# Patient Record
Sex: Female | Born: 1945 | Race: White | Hispanic: No | Smoking: Never smoker
Health system: Southern US, Community
[De-identification: ages and names within clinical notes are randomized; demographics above are authoritative.]

## PROBLEM LIST (undated history)

## (undated) DIAGNOSIS — K219 Gastro-esophageal reflux disease without esophagitis: Secondary | ICD-10-CM

## (undated) DIAGNOSIS — Z973 Presence of spectacles and contact lenses: Secondary | ICD-10-CM

## (undated) DIAGNOSIS — N3281 Overactive bladder: Secondary | ICD-10-CM

## (undated) DIAGNOSIS — I872 Venous insufficiency (chronic) (peripheral): Secondary | ICD-10-CM

## (undated) DIAGNOSIS — H8309 Labyrinthitis, unspecified ear: Secondary | ICD-10-CM

## (undated) DIAGNOSIS — M1990 Unspecified osteoarthritis, unspecified site: Secondary | ICD-10-CM

## (undated) DIAGNOSIS — I8393 Asymptomatic varicose veins of bilateral lower extremities: Secondary | ICD-10-CM

## (undated) DIAGNOSIS — I499 Cardiac arrhythmia, unspecified: Secondary | ICD-10-CM

## (undated) DIAGNOSIS — K2270 Barrett's esophagus without dysplasia: Secondary | ICD-10-CM

## (undated) HISTORY — PX: NASAL SEPTUM SURGERY: SHX37

## (undated) HISTORY — PX: BREAST CYST ASPIRATION: SHX578

## (undated) HISTORY — DX: Barrett's esophagus without dysplasia: K22.70

---

## 1974-10-10 HISTORY — PX: ABDOMINAL HYSTERECTOMY: SHX81

## 1989-10-10 HISTORY — PX: BREAST BIOPSY: SHX20

## 1993-10-10 HISTORY — PX: KNEE ARTHROSCOPY: SUR90

## 2005-03-07 ENCOUNTER — Ambulatory Visit: Payer: Self-pay | Admitting: Unknown Physician Specialty

## 2005-03-28 ENCOUNTER — Ambulatory Visit: Payer: Self-pay | Admitting: Unknown Physician Specialty

## 2005-07-27 ENCOUNTER — Ambulatory Visit: Payer: Self-pay | Admitting: Neurosurgery

## 2005-11-30 ENCOUNTER — Ambulatory Visit: Payer: Self-pay | Admitting: Unknown Physician Specialty

## 2005-12-30 ENCOUNTER — Ambulatory Visit: Payer: Self-pay | Admitting: Unknown Physician Specialty

## 2007-01-01 ENCOUNTER — Ambulatory Visit: Payer: Self-pay | Admitting: Unknown Physician Specialty

## 2007-10-11 DIAGNOSIS — I499 Cardiac arrhythmia, unspecified: Secondary | ICD-10-CM

## 2007-10-11 HISTORY — DX: Cardiac arrhythmia, unspecified: I49.9

## 2007-12-06 ENCOUNTER — Ambulatory Visit: Payer: Self-pay | Admitting: Unknown Physician Specialty

## 2008-11-18 ENCOUNTER — Ambulatory Visit: Payer: Self-pay | Admitting: Unknown Physician Specialty

## 2009-11-16 ENCOUNTER — Ambulatory Visit: Payer: Self-pay | Admitting: Unknown Physician Specialty

## 2010-11-17 ENCOUNTER — Ambulatory Visit: Payer: Self-pay | Admitting: Unknown Physician Specialty

## 2011-11-22 ENCOUNTER — Ambulatory Visit: Payer: Self-pay | Admitting: Unknown Physician Specialty

## 2012-10-10 HISTORY — PX: ESOPHAGOGASTRODUODENOSCOPY: SHX1529

## 2012-11-15 ENCOUNTER — Ambulatory Visit: Payer: Self-pay | Admitting: Unknown Physician Specialty

## 2013-04-22 ENCOUNTER — Other Ambulatory Visit: Payer: Self-pay

## 2013-04-23 ENCOUNTER — Ambulatory Visit: Payer: Self-pay | Admitting: Physician Assistant

## 2013-04-27 LAB — CULTURE, BLOOD (SINGLE)

## 2013-11-18 ENCOUNTER — Ambulatory Visit: Payer: Self-pay | Admitting: Physician Assistant

## 2014-11-10 ENCOUNTER — Ambulatory Visit: Payer: Self-pay | Admitting: Physician Assistant

## 2015-10-14 ENCOUNTER — Other Ambulatory Visit: Payer: Self-pay | Admitting: Physician Assistant

## 2015-10-14 DIAGNOSIS — Z1231 Encounter for screening mammogram for malignant neoplasm of breast: Secondary | ICD-10-CM

## 2015-11-17 ENCOUNTER — Ambulatory Visit
Admission: RE | Admit: 2015-11-17 | Discharge: 2015-11-17 | Disposition: A | Discharge status: Home / Self Care | Payer: BC Managed Care – PPO | Source: Ambulatory Visit | Attending: Physician Assistant | Admitting: Physician Assistant

## 2015-11-17 DIAGNOSIS — Z1231 Encounter for screening mammogram for malignant neoplasm of breast: Secondary | ICD-10-CM | POA: Insufficient documentation

## 2016-11-10 ENCOUNTER — Other Ambulatory Visit: Payer: Self-pay | Admitting: Physician Assistant

## 2016-11-10 DIAGNOSIS — Z1231 Encounter for screening mammogram for malignant neoplasm of breast: Secondary | ICD-10-CM

## 2016-12-05 ENCOUNTER — Ambulatory Visit
Admission: RE | Admit: 2016-12-05 | Discharge: 2016-12-05 | Disposition: A | Discharge status: Home / Self Care | Payer: BC Managed Care – PPO | Source: Ambulatory Visit | Attending: Physician Assistant | Admitting: Physician Assistant

## 2016-12-05 DIAGNOSIS — Z1231 Encounter for screening mammogram for malignant neoplasm of breast: Secondary | ICD-10-CM | POA: Diagnosis not present

## 2016-12-19 ENCOUNTER — Ambulatory Visit: Payer: BC Managed Care – PPO

## 2016-12-22 ENCOUNTER — Ambulatory Visit: Payer: BC Managed Care – PPO

## 2017-10-18 ENCOUNTER — Other Ambulatory Visit: Payer: Self-pay | Admitting: Physician Assistant

## 2017-10-18 DIAGNOSIS — Z1231 Encounter for screening mammogram for malignant neoplasm of breast: Secondary | ICD-10-CM

## 2017-12-01 ENCOUNTER — Telehealth: Payer: Self-pay | Admitting: Gastroenterology

## 2017-12-01 NOTE — Telephone Encounter (Signed)
L/M BC PT WANTED SOONER APT WITH  DR. Allen Norris IN [facility] HAD OPENING ON 2/28

## 2017-12-05 ENCOUNTER — Ambulatory Visit
Admission: RE | Admit: 2017-12-05 | Discharge: 2017-12-05 | Disposition: A | Discharge status: Home / Self Care | Payer: BC Managed Care – PPO | Source: Ambulatory Visit | Attending: Physician Assistant | Admitting: Physician Assistant

## 2017-12-05 DIAGNOSIS — Z1231 Encounter for screening mammogram for malignant neoplasm of breast: Secondary | ICD-10-CM | POA: Diagnosis present

## 2017-12-07 ENCOUNTER — Encounter: Payer: Self-pay | Admitting: Gastroenterology

## 2017-12-07 ENCOUNTER — Other Ambulatory Visit: Payer: Self-pay

## 2017-12-07 ENCOUNTER — Ambulatory Visit: Payer: BC Managed Care – PPO | Admitting: Gastroenterology

## 2017-12-07 VITALS — BP 129/67 | HR 98 | Ht 62.0 in | Wt 119.0 lb

## 2017-12-07 DIAGNOSIS — N6019 Diffuse cystic mastopathy of unspecified breast: Secondary | ICD-10-CM | POA: Insufficient documentation

## 2017-12-07 DIAGNOSIS — K2270 Barrett's esophagus without dysplasia: Secondary | ICD-10-CM | POA: Diagnosis not present

## 2017-12-07 DIAGNOSIS — K921 Melena: Secondary | ICD-10-CM

## 2017-12-07 DIAGNOSIS — N3281 Overactive bladder: Secondary | ICD-10-CM | POA: Insufficient documentation

## 2017-12-07 DIAGNOSIS — M810 Age-related osteoporosis without current pathological fracture: Secondary | ICD-10-CM | POA: Insufficient documentation

## 2017-12-07 DIAGNOSIS — M1990 Unspecified osteoarthritis, unspecified site: Secondary | ICD-10-CM | POA: Insufficient documentation

## 2017-12-07 DIAGNOSIS — A6000 Herpesviral infection of urogenital system, unspecified: Secondary | ICD-10-CM | POA: Insufficient documentation

## 2017-12-07 DIAGNOSIS — K219 Gastro-esophageal reflux disease without esophagitis: Secondary | ICD-10-CM | POA: Insufficient documentation

## 2017-12-07 DIAGNOSIS — H8309 Labyrinthitis, unspecified ear: Secondary | ICD-10-CM | POA: Insufficient documentation

## 2017-12-07 DIAGNOSIS — E785 Hyperlipidemia, unspecified: Secondary | ICD-10-CM | POA: Insufficient documentation

## 2017-12-07 DIAGNOSIS — K22719 Barrett's esophagus with dysplasia, unspecified: Secondary | ICD-10-CM

## 2017-12-07 NOTE — Progress Notes (Addendum)
Gastroenterology Consultation  Referring Provider:     Marinda Elk, MD Primary Care Physician:  Marinda Elk, MD Primary Gastroenterologist:  Dr. Allen Norris     Reason for Consultation:     Barrett's esophagus and heme positive stools        HPI:   Diana Berg is a 72 y.o. y/o female referred for consultation & management of Barrett's esophagus and heme positive stools by Dr. Carrie Mew, Wilhemena Durie, MD.  This patient, this in today with a finding of heme-positive stools. The patient had a normal CBC. The patient had a history of Barrett's esophagus and her last EGD were done in 2016. She reports that she had a colonoscopy back in 2010 and at that time she had 2 hyperplastic polyps removed. There is no report of any unexplained weight loss fevers chills nausea or vomiting. The patient now comes to me after being referred by her primary care provider because a friend of hers had a good experience with me. The patient denies seeing any black stools or bloody stools. She also reports that she has some abdominal pain but it is intermittent and very minimal. She also states that she has had chronic constipation. She is presently not taking anything for the constipation. The patient does state that in the middle of night she will wake up and have burning in the back of her throat. The patient's presently on Pepcid because she had a reaction to Zantac and was stopped from taking omeprazole because of her history of osteoporosis.  Past Medical History:  Diagnosis Date  . Barrett's esophagus     Past Surgical History:  Procedure Laterality Date  . BREAST CYST ASPIRATION Right 1990's   neg    Prior to Admission medications   Medication Sig Start Date End Date Taking? Authorizing Provider  aspirin (ASPIR-LOW) 81 MG EC tablet Take by mouth. 10/14/15  Yes [provider]  atorvastatin (LIPITOR) 10 MG tablet TK 1 T PO QD 11/27/17  Yes [provider]  Biotin 300 MCG TABS  Take by mouth.   Yes [provider]  Calcium-Magnesium-Vitamin D (CITRACAL CALCIUM+D) 350-09-381 MG-MG-UNIT TB24 Take by mouth.   Yes [provider]  Chromium Picolinate 400 MCG TABS Take by mouth.   Yes [provider]  Coenzyme Q10 100 MG capsule Take by mouth.   Yes [provider]  famotidine (PEPCID) 20 MG tablet TK 1 T PO  Q NIGHT 11/27/17  Yes [provider]  Lutein 20 MG TABS Take by mouth.   Yes [provider]  meclizine (ANTIVERT) 12.5 MG tablet Take by mouth. 10/18/17  Yes [provider]  Multiple Vitamin (MULTI-VITAMINS) TABS Take by mouth.   Yes [provider]  tolterodine (DETROL) 1 MG tablet TK 1 T PO  BID 11/13/17  Yes [provider]  Probiotic CAPS Take by mouth.    [provider]    Family History  Problem Relation Age of Onset  . Heart disease Father   . Breast cancer Neg Hx      Social History   Tobacco Use  . Smoking status: Never Smoker  . Smokeless tobacco: Never Used  Substance Use Topics  . Alcohol use: No    Frequency: Never  . Drug use: No    Allergies as of 12/07/2017 - Review Complete 12/07/2017  Allergen Reaction Noted  . Other  03/24/2014  . Penicillin v  03/24/2014    Review of Systems:  All systems reviewed and negative except where noted in HPI.   Physical Exam:  BP 129/67   Pulse 98   Ht 5\' 2"  (1.575 m)   Wt 119 lb (54 kg)   BMI 21.77 kg/m  No LMP recorded. Patient has had a hysterectomy. Psych:  Alert and cooperative. Normal mood and affect. General:   Alert,  Well-developed, well-nourished, pleasant and cooperative in NAD Head:  Normocephalic and atraumatic. Eyes:  Sclera clear, no icterus.   Conjunctiva pink. Ears:  Normal auditory acuity. Nose:  No deformity, discharge, or lesions. Mouth:  No deformity or lesions,oropharynx pink & moist. Neck:  Supple; no masses or thyromegaly. Lungs:  Respirations even and unlabored.  Clear  throughout to auscultation.   No wheezes, crackles, or rhonchi. No acute distress. Heart:  Regular rate and rhythm; no murmurs, clicks, rubs, or gallops. Abdomen:  Normal bowel sounds.  No bruits.  Soft, non-tender and non-distended without masses, hepatosplenomegaly or hernias noted.  No guarding or rebound tenderness.  Negative Carnett sign.   Rectal:  Deferred.  Msk:  Symmetrical without gross deformities.  Good, equal movement & strength bilaterally. Pulses:  Normal pulses noted. Extremities:  No clubbing or edema.  No cyanosis. Neurologic:  Alert and oriented x3;  grossly normal neurologically. Skin:  Intact without significant lesions or rashes.  No jaundice. Lymph Nodes:  No significant cervical adenopathy. Psych:  Alert and cooperative. Normal mood and affect.  Imaging Studies: Mm Digital Screening Bilateral  Result Date: 12/05/2017 CLINICAL DATA:  Screening. EXAM: DIGITAL SCREENING BILATERAL MAMMOGRAM WITH CAD COMPARISON:  Previous exam(s). ACR Breast Density Category c: The breast tissue is heterogeneously dense, which may obscure small masses. FINDINGS: There are no findings suspicious for malignancy. Images were processed with CAD. IMPRESSION: No mammographic evidence of malignancy. A result letter of this screening mammogram will be mailed directly to the patient. RECOMMENDATION: Screening mammogram in one year. (Code:SM-B-01Y) BI-RADS CATEGORY  1: Negative. Electronically Signed   By: Curlene Dolphin M.D.   On: 12/05/2017 16:06    Assessment and Plan:   Diana Berg is a 72 y.o. y/o female who comes in today with a history of being found to have heme-positive stools with a normal hemoglobin. The patient has also reported constipation and has been told to take MiraLAX every day. The patient will also be set up for a screening colonoscopy and a surveillance EGD for her Barrett's esophagus. The patient's rectal bleeding is likely due from hemorrhoids or fissure because she states she  does feel a tearing when she has hard stools.   Lucilla Lame, MD. Marval Regal   Note: This dictation was prepared with Dragon dictation along with smaller phrase technology. Any transcriptional errors that result from this process are unintentional.

## 2017-12-12 ENCOUNTER — Ambulatory Visit: Payer: BC Managed Care – PPO | Admitting: Gastroenterology

## 2017-12-13 ENCOUNTER — Other Ambulatory Visit: Payer: Self-pay

## 2017-12-13 ENCOUNTER — Encounter: Payer: Self-pay | Admitting: *Deleted

## 2017-12-15 NOTE — Discharge Instructions (Signed)
General Anesthesia, Adult, Care After °These instructions provide you with information about caring for yourself after your procedure. Your health care provider may also give you more specific instructions. Your treatment has been planned according to current medical practices, but problems sometimes occur. Call your health care provider if you have any problems or questions after your procedure. °What can I expect after the procedure? °After the procedure, it is common to have: °· Vomiting. °· A sore throat. °· Mental slowness. ° °It is common to feel: °· Nauseous. °· Cold or shivery. °· Sleepy. °· Tired. °· Sore or achy, even in parts of your body where you did not have surgery. ° °Follow these instructions at home: °For at least 24 hours after the procedure: °· Do not: °? Participate in activities where you could fall or become injured. °? Drive. °? Use heavy machinery. °? Drink alcohol. °? Take sleeping pills or medicines that cause drowsiness. °? Make important decisions or sign legal documents. °? Take care of children on your own. °· Rest. °Eating and drinking °· If you vomit, drink water, juice, or soup when you can drink without vomiting. °· Drink enough fluid to keep your urine clear or pale yellow. °· Make sure you have little or no nausea before eating solid foods. °· Follow the diet recommended by your health care provider. °General instructions °· Have a responsible adult stay with you until you are awake and alert. °· Return to your normal activities as told by your health care provider. Ask your health care provider what activities are safe for you. °· Take over-the-counter and prescription medicines only as told by your health care provider. °· If you smoke, do not smoke without supervision. °· Keep all follow-up visits as told by your health care provider. This is important. °Contact a health care provider if: °· You continue to have nausea or vomiting at home, and medicines are not helpful. °· You  cannot drink fluids or start eating again. °· You cannot urinate after 8-12 hours. °· You develop a skin rash. °· You have fever. °· You have increasing redness at the site of your procedure. °Get help right away if: °· You have difficulty breathing. °· You have chest pain. °· You have unexpected bleeding. °· You feel that you are having a life-threatening or urgent problem. °This information is not intended to replace advice given to you by your health care provider. Make sure you discuss any questions you have with your health care provider. °Document Released: 01/02/2001 Document Revised: 02/29/2016 Document Reviewed: 09/10/2015 °Elsevier Interactive Patient Education © 2018 [employer] Inc. ° °

## 2017-12-18 ENCOUNTER — Ambulatory Visit: Payer: BC Managed Care – PPO | Admitting: Anesthesiology

## 2017-12-18 ENCOUNTER — Ambulatory Visit
Admission: RE | Admit: 2017-12-18 | Discharge: 2017-12-18 | Disposition: A | Discharge status: Home / Self Care | Payer: BC Managed Care – PPO | Source: Ambulatory Visit | Attending: Gastroenterology | Admitting: Gastroenterology

## 2017-12-18 ENCOUNTER — Encounter
Admission: RE | Disposition: A | Discharge status: Home / Self Care | Payer: Self-pay | Source: Ambulatory Visit | Attending: Gastroenterology

## 2017-12-18 DIAGNOSIS — K2270 Barrett's esophagus without dysplasia: Secondary | ICD-10-CM | POA: Diagnosis not present

## 2017-12-18 DIAGNOSIS — K22719 Barrett's esophagus with dysplasia, unspecified: Secondary | ICD-10-CM

## 2017-12-18 DIAGNOSIS — K2980 Duodenitis without bleeding: Secondary | ICD-10-CM | POA: Insufficient documentation

## 2017-12-18 DIAGNOSIS — K2950 Unspecified chronic gastritis without bleeding: Secondary | ICD-10-CM | POA: Insufficient documentation

## 2017-12-18 DIAGNOSIS — N3281 Overactive bladder: Secondary | ICD-10-CM | POA: Diagnosis not present

## 2017-12-18 DIAGNOSIS — R195 Other fecal abnormalities: Secondary | ICD-10-CM | POA: Diagnosis not present

## 2017-12-18 DIAGNOSIS — D123 Benign neoplasm of transverse colon: Secondary | ICD-10-CM | POA: Insufficient documentation

## 2017-12-18 DIAGNOSIS — K640 First degree hemorrhoids: Secondary | ICD-10-CM | POA: Diagnosis not present

## 2017-12-18 DIAGNOSIS — K219 Gastro-esophageal reflux disease without esophagitis: Secondary | ICD-10-CM | POA: Diagnosis not present

## 2017-12-18 DIAGNOSIS — K449 Diaphragmatic hernia without obstruction or gangrene: Secondary | ICD-10-CM | POA: Diagnosis not present

## 2017-12-18 HISTORY — DX: Presence of spectacles and contact lenses: Z97.3

## 2017-12-18 HISTORY — PX: POLYPECTOMY: SHX5525

## 2017-12-18 HISTORY — DX: Asymptomatic varicose veins of bilateral lower extremities: I83.93

## 2017-12-18 HISTORY — DX: Labyrinthitis, unspecified ear: H83.09

## 2017-12-18 HISTORY — PX: COLONOSCOPY WITH PROPOFOL: SHX5780

## 2017-12-18 HISTORY — DX: Cardiac arrhythmia, unspecified: I49.9

## 2017-12-18 HISTORY — DX: Unspecified osteoarthritis, unspecified site: M19.90

## 2017-12-18 HISTORY — DX: Gastro-esophageal reflux disease without esophagitis: K21.9

## 2017-12-18 HISTORY — DX: Venous insufficiency (chronic) (peripheral): I87.2

## 2017-12-18 HISTORY — DX: Overactive bladder: N32.81

## 2017-12-18 HISTORY — PX: ESOPHAGOGASTRODUODENOSCOPY (EGD) WITH PROPOFOL: SHX5813

## 2017-12-18 SURGERY — COLONOSCOPY WITH PROPOFOL
Anesthesia: General | Wound class: Contaminated

## 2017-12-18 MED ORDER — OMEPRAZOLE 20 MG PO CPDR: 20.0000 mg | DELAYED_RELEASE_CAPSULE | Freq: Every day | ORAL | 11 refills | Status: DC

## 2017-12-18 MED ORDER — ACETAMINOPHEN 160 MG/5ML PO SOLN: 325.0000 mg | Freq: Once | ORAL | Status: DC

## 2017-12-18 MED ORDER — LACTATED RINGERS IV SOLN
INTRAVENOUS | Status: DC
  Administered 2017-12-18: 08:00:00 via INTRAVENOUS

## 2017-12-18 MED ORDER — ACETAMINOPHEN 325 MG PO TABS: 325.0000 mg | ORAL_TABLET | Freq: Once | ORAL | Status: DC

## 2017-12-18 MED ORDER — PROPOFOL 10 MG/ML IV BOLUS
INTRAVENOUS | Status: DC | PRN
  Administered 2017-12-18: 100 mg via INTRAVENOUS
  Administered 2017-12-18 (×3): 20 mg via INTRAVENOUS
  Administered 2017-12-18: 10 mg via INTRAVENOUS
  Administered 2017-12-18: 50 mg via INTRAVENOUS

## 2017-12-18 MED ORDER — SODIUM CHLORIDE 0.9 % IV SOLN: INTRAVENOUS | Status: DC

## 2017-12-18 MED ORDER — STERILE WATER FOR IRRIGATION IR SOLN
Status: DC | PRN
  Administered 2017-12-18: 09:00:00

## 2017-12-18 MED ORDER — LIDOCAINE HCL (CARDIAC) 20 MG/ML IV SOLN
INTRAVENOUS | Status: DC | PRN
  Administered 2017-12-18: 30 mg via INTRAVENOUS

## 2017-12-18 SURGICAL SUPPLY — 36 items
BALLN DILATOR 10-12 8 (BALLOONS)
BALLN DILATOR 12-15 8 (BALLOONS)
BALLN DILATOR 15-18 8 (BALLOONS)
BALLN DILATOR CRE 0-12 8 (BALLOONS)
BALLN DILATOR ESOPH 8 10 CRE (MISCELLANEOUS) IMPLANT
BALLOON DILATOR 12-15 8 (BALLOONS) IMPLANT
BALLOON DILATOR 15-18 8 (BALLOONS) IMPLANT
BALLOON DILATOR CRE 0-12 8 (BALLOONS) IMPLANT
BLOCK BITE 60FR ADLT L/F GRN (MISCELLANEOUS) ×3 IMPLANT
CANISTER SUCT 1200ML W/VALVE (MISCELLANEOUS) ×3 IMPLANT
CLIP HMST 235XBRD CATH ROT (MISCELLANEOUS) IMPLANT
CLIP RESOLUTION 360 11X235 (MISCELLANEOUS)
ELECT REM PT RETURN 9FT ADLT (ELECTROSURGICAL)
ELECTRODE REM PT RTRN 9FT ADLT (ELECTROSURGICAL) IMPLANT
FCP ESCP3.2XJMB 240X2.8X (MISCELLANEOUS)
FORCEPS BIOP RAD 4 LRG CAP 4 (CUTTING FORCEPS) ×3 IMPLANT
FORCEPS BIOP RJ4 240 W/NDL (MISCELLANEOUS)
FORCEPS ESCP3.2XJMB 240X2.8X (MISCELLANEOUS) IMPLANT
GOWN CVR UNV OPN BCK APRN NK (MISCELLANEOUS) ×4 IMPLANT
GOWN ISOL THUMB LOOP REG UNIV (MISCELLANEOUS) ×2
INJECTOR VARIJECT VIN23 (MISCELLANEOUS) IMPLANT
KIT DEFENDO VALVE AND CONN (KITS) IMPLANT
KIT ENDO PROCEDURE OLY (KITS) ×3 IMPLANT
MARKER SPOT ENDO TATTOO 5ML (MISCELLANEOUS) IMPLANT
PROBE APC STR FIRE (PROBE) IMPLANT
RETRIEVER NET PLAT FOOD (MISCELLANEOUS) IMPLANT
RETRIEVER NET ROTH 2.5X230 LF (MISCELLANEOUS) IMPLANT
SNARE SHORT THROW 13M SML OVAL (MISCELLANEOUS) IMPLANT
SNARE SHORT THROW 30M LRG OVAL (MISCELLANEOUS) IMPLANT
SNARE SNG USE RND 15MM (INSTRUMENTS) IMPLANT
SPOT EX ENDOSCOPIC TATTOO (MISCELLANEOUS)
SYR INFLATION 60ML (SYRINGE) IMPLANT
TRAP ETRAP POLY (MISCELLANEOUS) IMPLANT
VARIJECT INJECTOR VIN23 (MISCELLANEOUS)
WATER STERILE IRR 250ML POUR (IV SOLUTION) ×3 IMPLANT
WIRE CRE 18-20MM 8CM F G (MISCELLANEOUS) IMPLANT

## 2017-12-18 NOTE — Op Note (Signed)
[facility] Gastroenterology Patient Name: Diana Berg Procedure Date: 12/18/2017 8:23 AM MRN: [id number] Account #: [id number] Date of Birth: 01-Mar-1946 Admit Type: Outpatient Age: 72 Room: [facility] OR ROOM 01 Gender: Female Note Status: Finalized Procedure:            Upper GI endoscopy Indications:          Heme positive stool Providers:            Lucilla Lame MD, MD Referring MD:         Precious Bard, MD (Referring MD) Medicines:            Propofol per Anesthesia Complications:        No immediate complications. Procedure:            Pre-Anesthesia Assessment:                       - Prior to the procedure, a History and Physical was                        performed, and patient medications and allergies were                        reviewed. The patient's tolerance of previous                        anesthesia was also reviewed. The risks and benefits of                        the procedure and the sedation options and risks were                        discussed with the patient. All questions were                        answered, and informed consent was obtained. Prior                        Anticoagulants: The patient has taken no previous                        anticoagulant or antiplatelet agents. ASA Grade                        Assessment: II - A patient with mild systemic disease.                        After reviewing the risks and benefits, the patient was                        deemed in satisfactory condition to undergo the                        procedure.                       After obtaining informed consent, the endoscope was                        passed under direct vision. Throughout the procedure,  the patient's blood pressure, pulse, and oxygen                        saturations were monitored continuously. The Olympus                        [id number]-[id number] Endoscope (S#. [id number]) was introduced   through the mouth, and advanced to the second part of                        duodenum. The upper GI endoscopy was accomplished                        without difficulty. The patient tolerated the procedure                        well. Findings:      A small hiatal hernia was present.      The entire examined stomach was normal. Biopsies were taken with a cold       forceps for histology.      Localized moderate inflammation characterized by erosions was found in       the duodenal bulb. Impression:           - Small hiatal hernia.                       - Normal stomach. Biopsied.                       - Duodenitis. Recommendation:       - Discharge patient to home.                       - Resume previous diet.                       - Continue present medications.                       - Await pathology results.                       - Perform a colonoscopy today. Procedure Code(s):    --- Professional ---                       [phone], Esophagogastroduodenoscopy, flexible, transoral;                        with biopsy, single or multiple Diagnosis Code(s):    --- Professional ---                       R19.5, Other fecal abnormalities                       K29.80, Duodenitis without bleeding CPT copyright 2016 American Medical Association. All rights reserved. The codes documented in this report are preliminary and upon coder review may  be revised to meet current compliance requirements. Lucilla Lame MD, MD 12/18/2017 8:34:40 AM This report has been signed electronically. Number of Addenda: 0 Note Initiated On: 12/18/2017 8:23 AM Total Procedure Duration: 0 hours 2 minutes 16 seconds       [facility]

## 2017-12-18 NOTE — Transfer of Care (Signed)
Immediate Anesthesia Transfer of Care Note  Patient: AELYN STANALAND  Procedure(s) Performed: COLONOSCOPY WITH PROPOFOL (N/A ) ESOPHAGOGASTRODUODENOSCOPY (EGD) WITH PROPOFOL (N/A ) POLYPECTOMY  Patient Location: PACU  Anesthesia Type: General  Level of Consciousness: awake, alert  and patient cooperative  Airway and Oxygen Therapy: Patient Spontanous Breathing and Patient connected to supplemental oxygen  Post-op Assessment: Post-op Vital signs reviewed, Patient's Cardiovascular Status Stable, Respiratory Function Stable, Patent Airway and No signs of Nausea or vomiting  Post-op Vital Signs: Reviewed and stable  Complications: No apparent anesthesia complications

## 2017-12-18 NOTE — Anesthesia Preprocedure Evaluation (Signed)
Anesthesia Evaluation  Patient identified by MRN, date of birth, ID band Patient awake    Reviewed: Allergy & Precautions, H&P , NPO status , Patient's Chart, lab work & pertinent test results  Airway Mallampati: II  TM Distance: >3 FB Neck ROM: full    Dental no notable dental hx.    Pulmonary    Pulmonary exam normal breath sounds clear to auscultation       Cardiovascular Normal cardiovascular exam Rhythm:regular Rate:Normal     Neuro/Psych    GI/Hepatic GERD  ,  Endo/Other    Renal/GU      Musculoskeletal   Abdominal   Peds  Hematology   Anesthesia Other Findings   Reproductive/Obstetrics                             Anesthesia Physical Anesthesia Plan  ASA: II  Anesthesia Plan: General   Post-op Pain Management:    Induction: Intravenous  PONV Risk Score and Plan: 2 and Propofol infusion and Treatment may vary due to age or medical condition  Airway Management Planned: Natural Airway  Additional Equipment:   Intra-op Plan:   Post-operative Plan:   Informed Consent: I have reviewed the patients History and Physical, chart, labs and discussed the procedure including the risks, benefits and alternatives for the proposed anesthesia with the patient or authorized representative who has indicated his/her understanding and acceptance.       Plan Discussed with: CRNA  Anesthesia Plan Comments:         Anesthesia Quick Evaluation  

## 2017-12-18 NOTE — Anesthesia Procedure Notes (Signed)
Date/Time: 12/18/2017 8:25 AM Performed by: Cameron Ali, CRNA Pre-anesthesia Checklist: Patient identified, Emergency Drugs available, Suction available, Timeout performed and Patient being monitored Patient Re-evaluated:Patient Re-evaluated prior to induction Oxygen Delivery Method: Nasal cannula Placement Confirmation: positive ETCO2

## 2017-12-18 NOTE — Op Note (Signed)
[facility] Gastroenterology Patient Name: Leonetta Mcgivern Procedure Date: 12/18/2017 8:22 AM MRN: [id number] Account #: [id number] Date of Birth: Nov 26, 1945 Admit Type: Outpatient Age: 72 Room: [facility] OR ROOM 01 Gender: Female Note Status: Finalized Procedure:            Colonoscopy Indications:          Heme positive stool Providers:            Lucilla Lame MD, MD Medicines:            Propofol per Anesthesia Complications:        No immediate complications. Procedure:            Pre-Anesthesia Assessment:                       - Prior to the procedure, a History and Physical was                        performed, and patient medications and allergies were                        reviewed. The patient's tolerance of previous                        anesthesia was also reviewed. The risks and benefits of                        the procedure and the sedation options and risks were                        discussed with the patient. All questions were                        answered, and informed consent was obtained. Prior                        Anticoagulants: The patient has taken no previous                        anticoagulant or antiplatelet agents. ASA Grade                        Assessment: II - A patient with mild systemic disease.                        After reviewing the risks and benefits, the patient was                        deemed in satisfactory condition to undergo the                        procedure.                       After obtaining informed consent, the colonoscope was                        passed under direct vision. Throughout the procedure,                        the patient's blood pressure, pulse, and  oxygen                        saturations were monitored continuously. The [place] [id number]) was introduced through the                        anus and advanced to the the cecum, identified by                         appendiceal orifice and ileocecal valve. The                        colonoscopy was performed without difficulty. The                        patient tolerated the procedure well. The quality of                        the bowel preparation was excellent. Findings:      The perianal and digital rectal examinations were normal.      A 3 mm polyp was found in the transverse colon. The polyp was sessile.       The polyp was removed with a cold biopsy forceps. Resection and       retrieval were complete.      Non-bleeding internal hemorrhoids were found during retroflexion. The       hemorrhoids were Grade I (internal hemorrhoids that do not prolapse). Impression:           - One 3 mm polyp in the transverse colon, removed with                        a cold biopsy forceps. Resected and retrieved.                       - Non-bleeding internal hemorrhoids. Recommendation:       - Discharge patient to home.                       - Resume previous diet.                       - Continue present medications.                       - Await pathology results.                       - Repeat colonoscopy in 5 years if polyp adenoma and 10                        years if hyperplastic Procedure Code(s):    --- Professional ---                       [phone], Colonoscopy, flexible; with biopsy, single or                        multiple Diagnosis Code(s):    --- Professional ---  R19.5, Other fecal abnormalities                       D12.3, Benign neoplasm of transverse colon (hepatic                        flexure or splenic flexure) CPT copyright 2016 American Medical Association. All rights reserved. The codes documented in this report are preliminary and upon coder review may  be revised to meet current compliance requirements. Lucilla Lame MD, MD 12/18/2017 8:54:56 AM This report has been signed electronically. Number of Addenda: 0 Note Initiated On: 12/18/2017 8:22  AM Scope Withdrawal Time: 0 hours 11 minutes 53 seconds  Total Procedure Duration: 0 hours 15 minutes 51 seconds       [facility]

## 2017-12-18 NOTE — H&P (Signed)
Diana Lame, MD [address] [place], [place] [address] Phone:[phone] Fax : [phone]  Primary Care Physician:  Marinda Elk, MD Primary Gastroenterologist:  Dr. Allen Norris  Pre-Procedure History & Physical: HPI:  Diana Berg is a 72 y.o. female is here for an endoscopy and colonoscopy.   Past Medical History:  Diagnosis Date  . Barrett's esophagus   . Chronic venous insufficiency   . Degenerative joint disease   . Dysrhythmia 2009   PVCs  . GERD (gastroesophageal reflux disease)   . Labyrinthitis    no issues last 3 yrs  . OAB (overactive bladder)   . Osteoarthritis   . Varicose veins of both lower extremities   . Wears contact lenses     Past Surgical History:  Procedure Laterality Date  . ABDOMINAL HYSTERECTOMY  1976  . BREAST BIOPSY Right 1991  . BREAST CYST ASPIRATION Right 1990's   neg  . ESOPHAGOGASTRODUODENOSCOPY  2014  . KNEE ARTHROSCOPY Right 1995  . NASAL SEPTUM SURGERY  1970s    Prior to Admission medications   Medication Sig Start Date End Date Taking? Authorizing Provider  atorvastatin (LIPITOR) 10 MG tablet TK 1 T PO QD 11/27/17  Yes [provider]  Biotin 300 MCG TABS Take by mouth.   Yes [provider]  Calcium-Magnesium-Vitamin D (CITRACAL CALCIUM+D) 902-40-973 MG-MG-UNIT TB24 Take by mouth.   Yes [provider]  Chromium Picolinate 400 MCG TABS Take by mouth.   Yes [provider]  Coenzyme Q10 100 MG capsule Take by mouth.   Yes [provider]  famotidine (PEPCID) 20 MG tablet TK 1 T PO  Q NIGHT 11/27/17  Yes [provider]  Lutein 20 MG TABS Take by mouth.   Yes [provider]  meclizine (ANTIVERT) 12.5 MG tablet Take by mouth. 10/18/17  Yes [provider]  Multiple Vitamin (MULTI-VITAMINS) TABS Take by mouth.   Yes [provider]  tolterodine (DETROL) 1 MG tablet TK 1 T PO  BID 11/13/17  Yes [provider]    Allergies  as of 12/07/2017 - Review Complete 12/07/2017  Allergen Reaction Noted  . Other  03/24/2014  . Penicillin v  03/24/2014    Family History  Problem Relation Age of Onset  . Heart disease Father   . Breast cancer Neg Hx     Social History   Socioeconomic History  . Marital status: Divorced    Spouse name: Not on file  . Number of children: Not on file  . Years of education: Not on file  . Highest education level: Not on file  Social Needs  . Financial resource strain: Not on file  . Food insecurity - worry: Not on file  . Food insecurity - inability: Not on file  . Transportation needs - medical: Not on file  . Transportation needs - non-medical: Not on file  Occupational History  . Not on file  Tobacco Use  . Smoking status: Never Smoker  . Smokeless tobacco: Never Used  Substance and Sexual Activity  . Alcohol use: No    Frequency: Never  . Drug use: No  . Sexual activity: Not on file  Other Topics Concern  . Not on file  Social History Narrative  . Not on file    Review of Systems: See HPI, otherwise negative ROS  Physical Exam: BP 134/88   Pulse 100   Temp 98.4 F (36.9 C) (Temporal)   Resp 16  Ht 5\' 2"  (1.575 m)   Wt 114 lb (51.7 kg)   SpO2 97%   BMI 20.85 kg/m  General:   Alert,  pleasant and cooperative in NAD Head:  Normocephalic and atraumatic. Neck:  Supple; no masses or thyromegaly. Lungs:  Clear throughout to auscultation.    Heart:  Regular rate and rhythm. Abdomen:  Soft, nontender and nondistended. Normal bowel sounds, without guarding, and without rebound.   Neurologic:  Alert and  oriented x4;  grossly normal neurologically.  Impression/Plan: [name] is here for an endoscopy and colonoscopy to be performed for barrett's esophagus and heme positive stools.  Risks, benefits, limitations, and alternatives regarding  endoscopy and colonoscopy have been reviewed with the patient.  Questions have been answered.  All parties  agreeable.   Diana Lame, MD  12/18/2017, 7:37 AM

## 2017-12-18 NOTE — Anesthesia Postprocedure Evaluation (Signed)
Anesthesia Post Note  Patient: Diana Berg  Procedure(s) Performed: COLONOSCOPY WITH PROPOFOL (N/A ) ESOPHAGOGASTRODUODENOSCOPY (EGD) WITH PROPOFOL (N/A ) POLYPECTOMY  Patient location during evaluation: PACU Anesthesia Type: General Level of consciousness: awake and alert and oriented Pain management: satisfactory to patient Vital Signs Assessment: post-procedure vital signs reviewed and stable Respiratory status: spontaneous breathing, nonlabored ventilation and respiratory function stable Cardiovascular status: blood pressure returned to baseline and stable Postop Assessment: Adequate PO intake and No signs of nausea or vomiting Anesthetic complications: no    Raliegh Ip

## 2017-12-20 ENCOUNTER — Encounter: Payer: Self-pay | Admitting: Gastroenterology

## 2017-12-22 ENCOUNTER — Encounter: Payer: Self-pay | Admitting: Gastroenterology

## 2018-01-15 ENCOUNTER — Ambulatory Visit: Payer: BC Managed Care – PPO | Admitting: Gastroenterology

## 2018-02-20 ENCOUNTER — Other Ambulatory Visit: Payer: Self-pay | Admitting: Physician Assistant

## 2018-02-20 DIAGNOSIS — M5090 Cervical disc disorder, unspecified, unspecified cervical region: Secondary | ICD-10-CM

## 2018-02-27 ENCOUNTER — Ambulatory Visit
Admission: RE | Admit: 2018-02-27 | Discharge: 2018-02-27 | Disposition: A | Discharge status: Home / Self Care | Payer: Medicare Other | Source: Ambulatory Visit | Attending: Physician Assistant | Admitting: Physician Assistant

## 2018-02-27 DIAGNOSIS — M40292 Other kyphosis, cervical region: Secondary | ICD-10-CM | POA: Diagnosis not present

## 2018-02-27 DIAGNOSIS — Z981 Arthrodesis status: Secondary | ICD-10-CM | POA: Insufficient documentation

## 2018-02-27 DIAGNOSIS — M50221 Other cervical disc displacement at C4-C5 level: Secondary | ICD-10-CM | POA: Diagnosis not present

## 2018-02-27 DIAGNOSIS — M5090 Cervical disc disorder, unspecified, unspecified cervical region: Secondary | ICD-10-CM | POA: Insufficient documentation

## 2018-06-26 ENCOUNTER — Other Ambulatory Visit: Payer: Self-pay

## 2018-06-26 ENCOUNTER — Encounter: Payer: Self-pay | Admitting: *Deleted

## 2018-06-28 NOTE — Discharge Instructions (Signed)

## 2018-07-03 ENCOUNTER — Ambulatory Visit
Admission: RE | Admit: 2018-07-03 | Discharge: 2018-07-03 | Disposition: A | Discharge status: Home / Self Care | Payer: Medicare Other | Source: Ambulatory Visit | Attending: Ophthalmology | Admitting: Ophthalmology

## 2018-07-03 ENCOUNTER — Encounter
Admission: RE | Disposition: A | Discharge status: Home / Self Care | Payer: Self-pay | Source: Ambulatory Visit | Attending: Ophthalmology

## 2018-07-03 ENCOUNTER — Ambulatory Visit: Payer: Medicare Other | Admitting: Anesthesiology

## 2018-07-03 DIAGNOSIS — K219 Gastro-esophageal reflux disease without esophagitis: Secondary | ICD-10-CM | POA: Diagnosis not present

## 2018-07-03 DIAGNOSIS — I493 Ventricular premature depolarization: Secondary | ICD-10-CM | POA: Diagnosis not present

## 2018-07-03 DIAGNOSIS — I739 Peripheral vascular disease, unspecified: Secondary | ICD-10-CM | POA: Insufficient documentation

## 2018-07-03 DIAGNOSIS — H2512 Age-related nuclear cataract, left eye: Secondary | ICD-10-CM | POA: Diagnosis not present

## 2018-07-03 DIAGNOSIS — E785 Hyperlipidemia, unspecified: Secondary | ICD-10-CM | POA: Diagnosis not present

## 2018-07-03 DIAGNOSIS — I872 Venous insufficiency (chronic) (peripheral): Secondary | ICD-10-CM | POA: Diagnosis not present

## 2018-07-03 DIAGNOSIS — M810 Age-related osteoporosis without current pathological fracture: Secondary | ICD-10-CM | POA: Diagnosis not present

## 2018-07-03 DIAGNOSIS — K2270 Barrett's esophagus without dysplasia: Secondary | ICD-10-CM | POA: Diagnosis not present

## 2018-07-03 DIAGNOSIS — E7800 Pure hypercholesterolemia, unspecified: Secondary | ICD-10-CM | POA: Insufficient documentation

## 2018-07-03 DIAGNOSIS — Z880 Allergy status to penicillin: Secondary | ICD-10-CM | POA: Insufficient documentation

## 2018-07-03 DIAGNOSIS — Z79899 Other long term (current) drug therapy: Secondary | ICD-10-CM | POA: Insufficient documentation

## 2018-07-03 DIAGNOSIS — M1990 Unspecified osteoarthritis, unspecified site: Secondary | ICD-10-CM | POA: Insufficient documentation

## 2018-07-03 HISTORY — PX: CATARACT EXTRACTION W/PHACO: SHX586

## 2018-07-03 SURGERY — PHACOEMULSIFICATION, CATARACT, WITH IOL INSERTION
Anesthesia: Monitor Anesthesia Care | Site: Eye | Laterality: Left

## 2018-07-03 MED ORDER — EPINEPHRINE PF 1 MG/ML IJ SOLN
INTRAOCULAR | Status: DC | PRN
  Administered 2018-07-03: 62 mL via OPHTHALMIC

## 2018-07-03 MED ORDER — BRIMONIDINE TARTRATE-TIMOLOL 0.2-0.5 % OP SOLN
OPHTHALMIC | Status: DC | PRN
  Administered 2018-07-03: 1 [drp] via OPHTHALMIC

## 2018-07-03 MED ORDER — ONDANSETRON HCL 4 MG/2ML IJ SOLN
INTRAMUSCULAR | Status: DC | PRN
  Administered 2018-07-03: 4 mg via INTRAVENOUS

## 2018-07-03 MED ORDER — TETRACAINE HCL 0.5 % OP SOLN
1.0000 [drp] | OPHTHALMIC | Status: DC | PRN
  Administered 2018-07-03 (×2): 1 [drp] via OPHTHALMIC

## 2018-07-03 MED ORDER — LIDOCAINE HCL (PF) 2 % IJ SOLN
INTRAOCULAR | Status: DC | PRN
  Administered 2018-07-03: 1 mL

## 2018-07-03 MED ORDER — MOXIFLOXACIN HCL 0.5 % OP SOLN
1.0000 [drp] | OPHTHALMIC | Status: DC | PRN
  Administered 2018-07-03 (×3): 1 [drp] via OPHTHALMIC

## 2018-07-03 MED ORDER — LACTATED RINGERS IV SOLN: 10.0000 mL/h | INTRAVENOUS | Status: DC

## 2018-07-03 MED ORDER — ARMC OPHTHALMIC DILATING DROPS
1.0000 "application " | OPHTHALMIC | Status: DC | PRN
  Administered 2018-07-03 (×3): 1 via OPHTHALMIC

## 2018-07-03 MED ORDER — ONDANSETRON HCL 4 MG/2ML IJ SOLN: 4.0000 mg | Freq: Once | INTRAMUSCULAR | Status: DC | PRN

## 2018-07-03 MED ORDER — CEFUROXIME OPHTHALMIC INJECTION 1 MG/0.1 ML
INJECTION | OPHTHALMIC | Status: DC | PRN
  Administered 2018-07-03: 0.1 mL via INTRACAMERAL

## 2018-07-03 MED ORDER — MIDAZOLAM HCL 2 MG/2ML IJ SOLN
INTRAMUSCULAR | Status: DC | PRN
  Administered 2018-07-03: 1 mg via INTRAVENOUS

## 2018-07-03 MED ORDER — NA HYALUR & NA CHOND-NA HYALUR 0.4-0.35 ML IO KIT
PACK | INTRAOCULAR | Status: DC | PRN
  Administered 2018-07-03: 1 mL via INTRAOCULAR

## 2018-07-03 MED ORDER — FENTANYL CITRATE (PF) 100 MCG/2ML IJ SOLN
INTRAMUSCULAR | Status: DC | PRN
  Administered 2018-07-03: 50 ug via INTRAVENOUS

## 2018-07-03 SURGICAL SUPPLY — 21 items
CANNULA ANT/CHMB 27G (MISCELLANEOUS) ×1 IMPLANT
CANNULA ANT/CHMB 27GA (MISCELLANEOUS) ×3 IMPLANT
GLOVE SURG LX 7.5 STRW (GLOVE) ×2
GLOVE SURG LX STRL 7.5 STRW (GLOVE) ×1 IMPLANT
GLOVE SURG TRIUMPH 8.0 PF LTX (GLOVE) ×3 IMPLANT
GOWN STRL REUS W/ TWL LRG LVL3 (GOWN DISPOSABLE) ×2 IMPLANT
GOWN STRL REUS W/TWL LRG LVL3 (GOWN DISPOSABLE) ×4
LENS IOL ACRSF IQ ULTRA 12.5 (Intraocular Lens) IMPLANT
LENS IOL ACRYSOF IQ 12.5 (Intraocular Lens) ×3 IMPLANT
MARKER SKIN DUAL TIP RULER LAB (MISCELLANEOUS) ×3 IMPLANT
NDL FILTER BLUNT 18X1 1/2 (NEEDLE) ×1 IMPLANT
NEEDLE FILTER BLUNT 18X 1/2SAF (NEEDLE) ×2
NEEDLE FILTER BLUNT 18X1 1/2 (NEEDLE) ×1 IMPLANT
PACK CATARACT BRASINGTON (MISCELLANEOUS) ×3 IMPLANT
PACK EYE AFTER SURG (MISCELLANEOUS) ×3 IMPLANT
PACK OPTHALMIC (MISCELLANEOUS) ×3 IMPLANT
SYR 3ML LL SCALE MARK (SYRINGE) ×3 IMPLANT
SYR 5ML LL (SYRINGE) ×3 IMPLANT
SYR TB 1ML LUER SLIP (SYRINGE) ×3 IMPLANT
WATER STERILE IRR 500ML POUR (IV SOLUTION) ×3 IMPLANT
WIPE NON LINTING 3.25X3.25 (MISCELLANEOUS) ×3 IMPLANT

## 2018-07-03 NOTE — Anesthesia Preprocedure Evaluation (Signed)
Anesthesia Evaluation  Patient identified by MRN, date of birth, ID band Patient awake    Reviewed: Allergy & Precautions, H&P , NPO status , Patient's Chart, lab work & pertinent test results  Airway Mallampati: II  TM Distance: >3 FB     Dental no notable dental hx.    Pulmonary    breath sounds clear to auscultation       Cardiovascular + Peripheral Vascular Disease (venous insufficiency)   Rhythm:regular Rate:Normal  HLD   Neuro/Psych    GI/Hepatic GERD  ,Barrett's esophagus   Endo/Other    Renal/GU      Musculoskeletal  (+) Arthritis ,   Abdominal   Peds  Hematology   Anesthesia Other Findings   Reproductive/Obstetrics                             Anesthesia Physical  Anesthesia Plan  ASA: II  Anesthesia Plan: MAC   Post-op Pain Management:    Induction: Intravenous  PONV Risk Score and Plan:   Airway Management Planned: Natural Airway and Nasal Cannula  Additional Equipment:   Intra-op Plan:   Post-operative Plan:   Informed Consent: I have reviewed the patients History and Physical, chart, labs and discussed the procedure including the risks, benefits and alternatives for the proposed anesthesia with the patient or authorized representative who has indicated his/her understanding and acceptance.     Plan Discussed with: CRNA  Anesthesia Plan Comments:         Anesthesia Quick Evaluation

## 2018-07-03 NOTE — Op Note (Signed)
OPERATIVE NOTE  Diana Berg [id number] 07/03/2018   PREOPERATIVE DIAGNOSIS:  Nuclear sclerotic cataract left eye. H25.12   POSTOPERATIVE DIAGNOSIS:    Nuclear sclerotic cataract left eye.     PROCEDURE:  Phacoemusification with posterior chamber intraocular lens placement of the left eye   LENS:   Implant Name Type Inv. Item Serial No. Manufacturer Lot No. LRB No. Used  LENS IOL ACRYSOF IQ 12.5 - [id number] Intraocular Lens LENS IOL ACRYSOF IQ 12.5 [id number] ALCON  Left 1        ULTRASOUND TIME: 12  % of 0 minutes 39 seconds, CDE 4.6  SURGEON:  Wyonia Hough, MD   ANESTHESIA:  Topical with tetracaine drops and 2% Xylocaine jelly, augmented with 1% preservative-free intracameral lidocaine.    COMPLICATIONS:  None.   DESCRIPTION OF PROCEDURE:  The patient was identified in the holding room and transported to the operating room and placed in the supine position under the operating microscope.  The left eye was identified as the operative eye and it was prepped and draped in the usual sterile ophthalmic fashion.   A 1 millimeter clear-corneal paracentesis was made at the 3:00 position.  0.5 ml of preservative-free 1% lidocaine was injected into the anterior chamber.  The anterior chamber was filled with Viscoat viscoelastic.  A 2.4 millimeter keratome was used to make a near-clear corneal incision at the 12:00 position.  .  A curvilinear capsulorrhexis was made with a cystotome and capsulorrhexis forceps.  Balanced salt solution was used to hydrodissect and hydrodelineate the nucleus.   Phacoemulsification was then used in stop and chop fashion to remove the lens nucleus and epinucleus.  The remaining cortex was then removed using the irrigation and aspiration handpiece. Provisc was then placed into the capsular bag to distend it for lens placement.  A lens was then injected into the capsular bag.  The remaining viscoelastic was aspirated.   Wounds were hydrated with  balanced salt solution.  The anterior chamber was inflated to a physiologic pressure with balanced salt solution.  No wound leaks were noted. Cefuroxime 0.1 ml of a 10mg /ml solution was injected into the anterior chamber for a dose of 1 mg of intracameral antibiotic at the completion of the case.   Timolol and Brimonidine drops were applied to the eye.  The patient was taken to the recovery room in stable condition without complications of anesthesia or surgery.  [name] 07/03/2018, 11:14 AM

## 2018-07-03 NOTE — Anesthesia Procedure Notes (Signed)
Procedure Name: MAC Performed by: Chrislynn Mosely, CRNA Pre-anesthesia Checklist: Patient identified, Emergency Drugs available, Suction available, Timeout performed and Patient being monitored Patient Re-evaluated:Patient Re-evaluated prior to induction Oxygen Delivery Method: Nasal cannula Placement Confirmation: positive ETCO2       

## 2018-07-03 NOTE — Anesthesia Postprocedure Evaluation (Signed)
Anesthesia Post Note  Patient: Diana Berg  Procedure(s) Performed: CATARACT EXTRACTION PHACO AND INTRAOCULAR LENS PLACEMENT (IOC)  LEFT (Left Eye)  Patient location during evaluation: PACU Anesthesia Type: MAC Level of consciousness: awake and alert Pain management: pain level controlled Vital Signs Assessment: post-procedure vital signs reviewed and stable Respiratory status: spontaneous breathing, nonlabored ventilation, respiratory function stable and patient connected to nasal cannula oxygen Cardiovascular status: stable and blood pressure returned to baseline Postop Assessment: no apparent nausea or vomiting Anesthetic complications: no    Veda Canning

## 2018-07-03 NOTE — H&P (Signed)
The History and Physical notes are on paper, have been signed, and are to be scanned. The patient remains stable and unchanged from the H&P.   Previous H&P reviewed, patient examined, and there are no changes.  Diana Berg 07/03/2018 9:15 AM

## 2018-07-03 NOTE — Transfer of Care (Signed)
Immediate Anesthesia Transfer of Care Note  Patient: Diana Berg  Procedure(s) Performed: CATARACT EXTRACTION PHACO AND INTRAOCULAR LENS PLACEMENT (I[facility])  LEFT (Left Eye)  Patient Location: PACU  Anesthesia Type: MAC  Level of Consciousness: awake, alert  and patient cooperative  Airway and Oxygen Therapy: Patient Spontanous Breathing and Patient connected to supplemental oxygen  Post-op Assessment: Post-op Vital signs reviewed, Patient's Cardiovascular Status Stable, Respiratory Function Stable, Patent Airway and No signs of Nausea or vomiting  Post-op Vital Signs: Reviewed and stable  Complications: No apparent anesthesia complications

## 2018-07-04 ENCOUNTER — Encounter: Payer: Self-pay | Admitting: Ophthalmology

## 2018-07-19 ENCOUNTER — Other Ambulatory Visit: Payer: Self-pay

## 2018-07-19 ENCOUNTER — Encounter: Payer: Self-pay | Admitting: *Deleted

## 2018-07-23 NOTE — Discharge Instructions (Signed)

## 2018-07-25 ENCOUNTER — Ambulatory Visit: Payer: Medicare Other | Admitting: Anesthesiology

## 2018-07-25 ENCOUNTER — Encounter
Admission: RE | Disposition: A | Discharge status: Home / Self Care | Payer: Self-pay | Source: Ambulatory Visit | Attending: Ophthalmology

## 2018-07-25 ENCOUNTER — Ambulatory Visit
Admission: RE | Admit: 2018-07-25 | Discharge: 2018-07-25 | Disposition: A | Discharge status: Home / Self Care | Payer: Medicare Other | Source: Ambulatory Visit | Attending: Ophthalmology | Admitting: Ophthalmology

## 2018-07-25 DIAGNOSIS — I493 Ventricular premature depolarization: Secondary | ICD-10-CM | POA: Diagnosis not present

## 2018-07-25 DIAGNOSIS — H2511 Age-related nuclear cataract, right eye: Secondary | ICD-10-CM | POA: Insufficient documentation

## 2018-07-25 DIAGNOSIS — M810 Age-related osteoporosis without current pathological fracture: Secondary | ICD-10-CM | POA: Diagnosis not present

## 2018-07-25 DIAGNOSIS — E7800 Pure hypercholesterolemia, unspecified: Secondary | ICD-10-CM | POA: Diagnosis not present

## 2018-07-25 DIAGNOSIS — M1990 Unspecified osteoarthritis, unspecified site: Secondary | ICD-10-CM | POA: Diagnosis not present

## 2018-07-25 DIAGNOSIS — Z880 Allergy status to penicillin: Secondary | ICD-10-CM | POA: Diagnosis not present

## 2018-07-25 HISTORY — PX: CATARACT EXTRACTION W/PHACO: SHX586

## 2018-07-25 SURGERY — PHACOEMULSIFICATION, CATARACT, WITH IOL INSERTION
Anesthesia: Monitor Anesthesia Care | Site: Eye | Laterality: Right

## 2018-07-25 MED ORDER — NA HYALUR & NA CHOND-NA HYALUR 0.4-0.35 ML IO KIT
PACK | INTRAOCULAR | Status: DC | PRN
  Administered 2018-07-25: 1 mL via INTRAOCULAR

## 2018-07-25 MED ORDER — LACTATED RINGERS IV SOLN: 10.0000 mL/h | INTRAVENOUS | Status: DC

## 2018-07-25 MED ORDER — BRIMONIDINE TARTRATE-TIMOLOL 0.2-0.5 % OP SOLN
OPHTHALMIC | Status: DC | PRN
  Administered 2018-07-25: 1 [drp] via OPHTHALMIC

## 2018-07-25 MED ORDER — MIDAZOLAM HCL 2 MG/2ML IJ SOLN
INTRAMUSCULAR | Status: DC | PRN
  Administered 2018-07-25 (×2): 1 mg via INTRAVENOUS

## 2018-07-25 MED ORDER — MOXIFLOXACIN HCL 0.5 % OP SOLN
1.0000 [drp] | OPHTHALMIC | Status: DC | PRN
  Administered 2018-07-25 (×3): 1 [drp] via OPHTHALMIC

## 2018-07-25 MED ORDER — ONDANSETRON HCL 4 MG/2ML IJ SOLN
4.0000 mg | Freq: Once | INTRAMUSCULAR | Status: AC | PRN
  Administered 2018-07-25: 4 mg via INTRAVENOUS

## 2018-07-25 MED ORDER — EPINEPHRINE PF 1 MG/ML IJ SOLN
INTRAOCULAR | Status: DC | PRN
  Administered 2018-07-25: 64 mL via OPHTHALMIC

## 2018-07-25 MED ORDER — TETRACAINE HCL 0.5 % OP SOLN
1.0000 [drp] | OPHTHALMIC | Status: DC | PRN
  Administered 2018-07-25 (×2): 1 [drp] via OPHTHALMIC

## 2018-07-25 MED ORDER — LIDOCAINE HCL (PF) 2 % IJ SOLN
INTRAOCULAR | Status: DC | PRN
  Administered 2018-07-25: 2 mL

## 2018-07-25 MED ORDER — FENTANYL CITRATE (PF) 100 MCG/2ML IJ SOLN
INTRAMUSCULAR | Status: DC | PRN
  Administered 2018-07-25: 50 ug via INTRAVENOUS

## 2018-07-25 MED ORDER — CEFUROXIME OPHTHALMIC INJECTION 1 MG/0.1 ML
INJECTION | OPHTHALMIC | Status: DC | PRN
  Administered 2018-07-25: .2 mL via INTRACAMERAL

## 2018-07-25 MED ORDER — ARMC OPHTHALMIC DILATING DROPS
1.0000 "application " | OPHTHALMIC | Status: DC | PRN
  Administered 2018-07-25 (×3): 1 via OPHTHALMIC

## 2018-07-25 SURGICAL SUPPLY — 20 items
ACRYSOF IQ TORIC (Intraocular Lens) ×2 IMPLANT
CANNULA ANT/CHMB 27G (MISCELLANEOUS) ×1 IMPLANT
CANNULA ANT/CHMB 27GA (MISCELLANEOUS) ×3 IMPLANT
GLOVE SURG LX 7.5 STRW (GLOVE) ×2
GLOVE SURG LX STRL 7.5 STRW (GLOVE) ×1 IMPLANT
GLOVE SURG TRIUMPH 8.0 PF LTX (GLOVE) ×3 IMPLANT
GOWN STRL REUS W/ TWL LRG LVL3 (GOWN DISPOSABLE) ×2 IMPLANT
GOWN STRL REUS W/TWL LRG LVL3 (GOWN DISPOSABLE) ×4
MARKER SKIN DUAL TIP RULER LAB (MISCELLANEOUS) ×3 IMPLANT
NDL FILTER BLUNT 18X1 1/2 (NEEDLE) ×1 IMPLANT
NEEDLE FILTER BLUNT 18X 1/2SAF (NEEDLE) ×2
NEEDLE FILTER BLUNT 18X1 1/2 (NEEDLE) ×1 IMPLANT
PACK CATARACT BRASINGTON (MISCELLANEOUS) ×3 IMPLANT
PACK EYE AFTER SURG (MISCELLANEOUS) ×3 IMPLANT
PACK OPTHALMIC (MISCELLANEOUS) ×3 IMPLANT
SYR 3ML LL SCALE MARK (SYRINGE) ×3 IMPLANT
SYR 5ML LL (SYRINGE) ×3 IMPLANT
SYR TB 1ML LUER SLIP (SYRINGE) ×3 IMPLANT
WATER STERILE IRR 500ML POUR (IV SOLUTION) ×3 IMPLANT
WIPE NON LINTING 3.25X3.25 (MISCELLANEOUS) ×3 IMPLANT

## 2018-07-25 NOTE — Transfer of Care (Signed)
Immediate Anesthesia Transfer of Care Note  Patient: Diana Berg  Procedure(s) Performed: CATARACT EXTRACTION PHACO AND INTRAOCULAR LENS PLACEMENT (I[facility])RIGHT TORIC LENS (Right Eye)  Patient Location: PACU  Anesthesia Type: MAC  Level of Consciousness: awake, alert  and patient cooperative  Airway and Oxygen Therapy: Patient Spontanous Breathing and Patient connected to supplemental oxygen  Post-op Assessment: Post-op Vital signs reviewed, Patient's Cardiovascular Status Stable, Respiratory Function Stable, Patent Airway and No signs of Nausea or vomiting  Post-op Vital Signs: Reviewed and stable  Complications: No apparent anesthesia complications

## 2018-07-25 NOTE — Anesthesia Postprocedure Evaluation (Signed)
Anesthesia Post Note  Patient: Diana Berg  Procedure(s) Performed: CATARACT EXTRACTION PHACO AND INTRAOCULAR LENS PLACEMENT (I[facility])RIGHT TORIC LENS (Right Eye)  Patient location during evaluation: PACU Anesthesia Type: MAC Level of consciousness: awake and alert Pain management: pain level controlled Vital Signs Assessment: post-procedure vital signs reviewed and stable Respiratory status: spontaneous breathing Cardiovascular status: blood pressure returned to baseline Anesthetic complications: no    Jaci Standard, III,  Quinton Voth D

## 2018-07-25 NOTE — Anesthesia Preprocedure Evaluation (Signed)
Anesthesia Evaluation  Patient identified by MRN, date of birth, ID band Patient awake    Reviewed: Allergy & Precautions, H&P , NPO status , Patient's Chart, lab work & pertinent test results  Airway Mallampati: II  TM Distance: >3 FB Neck ROM: full    Dental no notable dental hx.    Pulmonary neg pulmonary ROS,    Pulmonary exam normal breath sounds clear to auscultation       Cardiovascular negative cardio ROS Normal cardiovascular exam     Neuro/Psych    GI/Hepatic Neg liver ROS, Medicated,  Endo/Other  negative endocrine ROS  Renal/GU negative Renal ROS     Musculoskeletal   Abdominal   Peds  Hematology negative hematology ROS (+)   Anesthesia Other Findings   Reproductive/Obstetrics negative OB ROS                             Anesthesia Physical Anesthesia Plan  ASA: II  Anesthesia Plan: MAC   Post-op Pain Management:    Induction:   PONV Risk Score and Plan:   Airway Management Planned:   Additional Equipment:   Intra-op Plan:   Post-operative Plan:   Informed Consent: I have reviewed the patients History and Physical, chart, labs and discussed the procedure including the risks, benefits and alternatives for the proposed anesthesia with the patient or authorized representative who has indicated his/her understanding and acceptance.     Plan Discussed with:   Anesthesia Plan Comments:         Anesthesia Quick Evaluation

## 2018-07-25 NOTE — Anesthesia Procedure Notes (Signed)
Procedure Name: MAC Date/Time: 07/25/2018 9:11 AM Performed by: Janna Arch, CRNA Pre-anesthesia Checklist: Patient identified, Emergency Drugs available, Suction available, Timeout performed and Patient being monitored Patient Re-evaluated:Patient Re-evaluated prior to induction Oxygen Delivery Method: Nasal cannula Placement Confirmation: positive ETCO2

## 2018-07-25 NOTE — H&P (Signed)
The History and Physical notes are on paper, have been signed, and are to be scanned. The patient remains stable and unchanged from the H&P.   Previous H&P reviewed, patient examined, and there are no changes.  Geno Sydnor 07/25/2018 8:44 AM

## 2018-07-25 NOTE — Op Note (Signed)
LOCATION:  [place]   PREOPERATIVE DIAGNOSIS:  Nuclear sclerotic cataract of the right eye.  H25.11   POSTOPERATIVE DIAGNOSIS:  Nuclear sclerotic cataract of the right eye.   PROCEDURE:  Phacoemulsification with Toric posterior chamber intraocular lens placement of the right eye.   LENS:   Implant Name Type Inv. Item Serial No. Manufacturer Lot No. LRB No. Used  ACRYSOF IQ TORIC Intraocular Lens  [id number] ALCON  Right 1     [id number] 11.0 D  Toric intraocular lens with 2.25 diopters of cylindrical power with axis orientation at 110 degrees.   ULTRASOUND TIME: 12 % of 1 minutes, 9 seconds.  CDE 8.0   SURGEON:  Wyonia Hough, MD   ANESTHESIA: Topical with tetracaine drops and 2% Xylocaine jelly, augmented with 1% preservative-free intracameral lidocaine. .   COMPLICATIONS:  None.   DESCRIPTION OF PROCEDURE:  The patient was identified in the holding room and transported to the operating suite and placed in the supine position under the operating microscope.  The right eye was identified as the operative eye, and it was prepped and draped in the usual sterile ophthalmic fashion.    A clear-corneal paracentesis incision was made at the 12:00 position.  0.5 ml of preservative-free 1% lidocaine was injected into the anterior chamber. The anterior chamber was filled with Viscoat.  A 2.4 millimeter near clear corneal incision was then made at the 9:00 position.  A cystotome and capsulorrhexis forceps were then used to make a curvilinear capsulorrhexis.  Hydrodissection and hydrodelineation were then performed using balanced salt solution.   Phacoemulsification was then used in stop and chop fashion to remove the lens, nucleus and epinucleus.  The remaining cortex was aspirated using the irrigation and aspiration handpiece.  Provisc viscoelastic was then placed into the capsular bag to distend it for lens placement.  The Verion digital marker was used to align the implant at  the intended axis.   A Toric lens was then injected into the capsular bag.  It was rotated clockwise until the axis marks on the lens were approximately 15 degrees in the counterclockwise direction to the intended alignment.  The viscoelastic was aspirated from the eye using the irrigation aspiration handpiece.  Then, a Koch spatula through the sideport incision was used to rotate the lens in a clockwise direction until the axis markings of the intraocular lens were lined up with the Verion alignment.  Balanced salt solution was then used to hydrate the wounds. Cefuroxime 0.1 ml of a 10mg /ml solution was injected into the anterior chamber for a dose of 1 mg of intracameral antibiotic at the completion of the case.    The eye was noted to have a physiologic pressure and there was no wound leak noted.   Timolol and Brimonidine drops were applied to the eye.  The patient was taken to the recovery room in stable condition having had no complications of anesthesia or surgery.  Jendayi Berling 07/25/2018, 9:33 AM

## 2018-07-26 ENCOUNTER — Encounter: Payer: Self-pay | Admitting: Ophthalmology

## 2018-10-23 DIAGNOSIS — R195 Other fecal abnormalities: Secondary | ICD-10-CM

## 2018-10-23 DIAGNOSIS — K2980 Duodenitis without bleeding: Secondary | ICD-10-CM

## 2018-10-23 DIAGNOSIS — D123 Benign neoplasm of transverse colon: Secondary | ICD-10-CM

## 2018-10-24 ENCOUNTER — Other Ambulatory Visit: Payer: Self-pay | Admitting: Physician Assistant

## 2018-10-24 DIAGNOSIS — Z1231 Encounter for screening mammogram for malignant neoplasm of breast: Secondary | ICD-10-CM

## 2018-11-20 ENCOUNTER — Ambulatory Visit
Admission: RE | Admit: 2018-11-20 | Discharge: 2018-11-20 | Disposition: A | Discharge status: Home / Self Care | Payer: Medicare Other | Source: Ambulatory Visit | Attending: Physician Assistant | Admitting: Physician Assistant

## 2018-11-20 DIAGNOSIS — Z1231 Encounter for screening mammogram for malignant neoplasm of breast: Secondary | ICD-10-CM | POA: Diagnosis present

## 2019-04-06 ENCOUNTER — Other Ambulatory Visit: Payer: Self-pay | Admitting: Family Medicine

## 2019-04-06 DIAGNOSIS — Z20822 Contact with and (suspected) exposure to covid-19: Secondary | ICD-10-CM

## 2019-04-12 LAB — NOVEL CORONAVIRUS, NAA: SARS-CoV-2, NAA: NOT DETECTED

## 2019-07-16 ENCOUNTER — Other Ambulatory Visit: Payer: Self-pay

## 2019-07-16 ENCOUNTER — Ambulatory Visit (INDEPENDENT_AMBULATORY_CARE_PROVIDER_SITE_OTHER): Payer: Medicare Other | Admitting: Vascular Surgery

## 2019-07-16 ENCOUNTER — Encounter (INDEPENDENT_AMBULATORY_CARE_PROVIDER_SITE_OTHER): Payer: Self-pay | Admitting: Vascular Surgery

## 2019-07-16 VITALS — BP 132/74 | HR 90 | Resp 10 | Ht 61.0 in | Wt 116.0 lb

## 2019-07-16 DIAGNOSIS — M79609 Pain in unspecified limb: Secondary | ICD-10-CM | POA: Insufficient documentation

## 2019-07-16 DIAGNOSIS — E785 Hyperlipidemia, unspecified: Secondary | ICD-10-CM

## 2019-07-16 DIAGNOSIS — M79605 Pain in left leg: Secondary | ICD-10-CM

## 2019-07-16 DIAGNOSIS — M79604 Pain in right leg: Secondary | ICD-10-CM

## 2019-07-16 DIAGNOSIS — M7989 Other specified soft tissue disorders: Secondary | ICD-10-CM

## 2019-07-16 NOTE — Assessment & Plan Note (Signed)
lipid control important in reducing the progression of atherosclerotic disease. Continue statin therapy  

## 2019-07-16 NOTE — Assessment & Plan Note (Signed)

## 2019-07-16 NOTE — Progress Notes (Signed)
Patient ID: Diana Berg, female   DOB: 02/13/1946, 73 y.o.   MRN: [id number]  Chief Complaint  Patient presents with  . New Patient (Initial Visit)    HPI Diana Berg is a 73 y.o. female.  I am asked to see the patient by Alvira Philips for evaluation of venous insufficiency.  The patient reports progressively worsening pain and swelling in her legs over the past several years.  The right leg is the more severely affected leg.  It feels tight and heavy.  She does describe what is worrisome for claudication symptoms in the right calf when she walks any distance.  The calf cramps and aches and is relieved by rest.  She has had prominent varicosities for many years, but for a long time they were not causing any symptoms.  The patient reports no ulceration or infection.  She has some the same symptoms in the left leg although not as severe.  There is no clear inciting event or causative factor that started the symptoms.  She also reports that she has restless leg syndrome which is getting worse.     Past Medical History:  Diagnosis Date  . Barrett's esophagus   . Chronic venous insufficiency   . Degenerative joint disease   . Dysrhythmia 2009   PVCs  . GERD (gastroesophageal reflux disease)   . Labyrinthitis    no issues last 3 yrs  . OAB (overactive bladder)   . Osteoarthritis   . Varicose veins of both lower extremities   . Wears contact lenses     Past Surgical History:  Procedure Laterality Date  . ABDOMINAL HYSTERECTOMY  1976  . BREAST BIOPSY Right 1991  . BREAST CYST ASPIRATION Right 1990's   neg  . CATARACT EXTRACTION W/PHACO Left 07/03/2018   Procedure: CATARACT EXTRACTION PHACO AND INTRAOCULAR LENS PLACEMENT ([place])  LEFT;  Surgeon: Leandrew Koyanagi, MD;  Location: [facility];  Service: Ophthalmology;  Laterality: Left;  PREFERS EARLY MORNING ARRIVAL CAN'T ARRIVE BEFORE [phone]  . CATARACT EXTRACTION W/PHACO Right 07/25/2018   Procedure: CATARACT  EXTRACTION PHACO AND INTRAOCULAR LENS PLACEMENT ([facility])RIGHT TORIC LENS;  Surgeon: Leandrew Koyanagi, MD;  Location: [facility];  Service: Ophthalmology;  Laterality: Right;  Requests early  . COLONOSCOPY WITH PROPOFOL N/A 12/18/2017   Procedure: COLONOSCOPY WITH PROPOFOL;  Surgeon: Lucilla Lame, MD;  Location: [facility];  Service: Endoscopy;  Laterality: N/A;  . ESOPHAGOGASTRODUODENOSCOPY  2014  . ESOPHAGOGASTRODUODENOSCOPY (EGD) WITH PROPOFOL N/A 12/18/2017   Procedure: ESOPHAGOGASTRODUODENOSCOPY (EGD) WITH PROPOFOL;  Surgeon: Lucilla Lame, MD;  Location: [facility];  Service: Endoscopy;  Laterality: N/A;  requests early  . KNEE ARTHROSCOPY Right 1995  . NASAL SEPTUM SURGERY  1970s  . POLYPECTOMY  12/18/2017   Procedure: POLYPECTOMY;  Surgeon: Lucilla Lame, MD;  Location: [facility];  Service: Endoscopy;;    Family History  Problem Relation Age of Onset  . Heart disease Father   . Breast cancer Neg Hx   no bleeding disorders, clotting disorders, or aneurysms  Social History Social History   Tobacco Use  . Smoking status: Never Smoker  . Smokeless tobacco: Never Used  Substance Use Topics  . Alcohol use: No    Frequency: Never  . Drug use: No    Allergies  Allergen Reactions  . Penicillin V     Pt does not recall having reaction  . Other Rash    "VERSAPIEN" per pt    Current Outpatient Medications  Medication Sig Dispense Refill  . atorvastatin (LIPITOR) 10 MG tablet TK 1 T PO QD  3  . Biotin 300 MCG TABS Take by mouth.    . Calcium-Magnesium-Vitamin D (CITRACAL CALCIUM+D) [id number] MG-MG-UNIT TB24 Take by mouth.    . Cholecalciferol (VITAMIN D3) 400 units CAPS Take by mouth 2 (two) times a week.    . Chromium Picolinate 400 MCG TABS Take by mouth.    . Coenzyme Q10 100 MG capsule Take by mouth.    . famotidine (PEPCID) 20 MG tablet TK 1 T PO  Q NIGHT  3  . Lutein 20 MG TABS Take by mouth.    . vitamin B-12 (CYANOCOBALAMIN) 1000 MCG  tablet Take 1,000 mcg by mouth 2 (two) times a week.     No current facility-administered medications for this visit.       REVIEW OF SYSTEMS (Negative unless checked)  Constitutional: [] Weight loss  [] Fever  [] Chills Cardiac: [] Chest pain   [] Chest pressure   [x] Palpitations   [] Shortness of breath when laying flat   [] Shortness of breath at rest   [] Shortness of breath with exertion. Vascular:  [] Pain in legs with walking   [] Pain in legs at rest   [] Pain in legs when laying flat   [] Claudication   [] Pain in feet when walking  [] Pain in feet at rest  [] Pain in feet when laying flat   [] History of DVT   [] Phlebitis   [x] Swelling in legs   [x] Varicose veins   [] Non-healing ulcers Pulmonary:   [] Uses home oxygen   [] Productive cough   [] Hemoptysis   [] Wheeze  [] COPD   [] Asthma Neurologic:  [] Dizziness  [] Blackouts   [] Seizures   [] History of stroke   [] History of TIA  [] Aphasia   [] Temporary blindness   [] Dysphagia   [] Weakness or numbness in arms   [] Weakness or numbness in legs Musculoskeletal:  [] Arthritis   [] Joint swelling   [] Joint pain   [] Low back pain Hematologic:  [] Easy bruising  [] Easy bleeding   [] Hypercoagulable state   [] Anemic  [] Hepatitis Gastrointestinal:  [] Blood in stool   [] Vomiting blood  [x] Gastroesophageal reflux/heartburn   [] Abdominal pain Genitourinary:  [] Chronic kidney disease   [] Difficult urination  [] Frequent urination  [] Burning with urination   [] Hematuria Skin:  [] Rashes   [] Ulcers   [] Wounds Psychological:  [] History of anxiety   []  History of major depression.    Physical Exam BP 132/74 (BP Location: Left Arm, Patient Position: Sitting, Cuff Size: Small)   Pulse 90   Resp 10   Ht 5\' 1"  (1.549 m)   Wt 116 lb (52.6 kg)   BMI 21.92 kg/m  Gen:  WD/WN, NAD.  Appears younger than stated age Head: [place]/AT, No temporalis wasting.  Ear/Nose/Throat: Hearing grossly intact, dentition good Eyes: Sclera non-icteric. Conjunctiva clear Neck: Supple. Trachea  midline Pulmonary:  Good air movement, no use of accessory muscles, respirations not labored.  Cardiac: RRR, No JVD Vascular: Varicosities fairly extensive and measuring up to 3-4 mm in the right lower extremity        Varicosities diffuse and measuring up to 2-3 mm in the left lower extremity Vessel Right Left  Radial Palpable Palpable                          PT  1+ palpable  1+ palpable  DP  1+ palpable Palpable   Gastrointestinal: soft, non-tender/non-distended.  Musculoskeletal: M/S 5/5 throughout.   1+ RLE edema.  Trace LLE edema Neurologic: Sensation grossly intact in extremities.  Symmetrical.  Speech is fluent.  Psychiatric: Judgment intact, Mood & affect appropriate for pt's clinical situation. Dermatologic: No rashes or ulcers noted.  No cellulitis or open wounds.    Radiology No results found.  Labs No results found for this or any previous visit (from the past 2160 hour(s)).  Assessment/Plan:  Hyperlipidemia, unspecified lipid control important in reducing the progression of atherosclerotic disease. Continue statin therapy   Swelling of limb Although the causes of leg swelling are many, venous insufficiency in this instance is likely to be playing a major contributing role.  Compression stocking prescription given, leg elevation and activity recommended.  Return following her noninvasive studies.  Pain in limb  Recommend:  The patient has atypical pain symptoms for pure atherosclerotic disease. However, on physical exam there is evidence of mixed venous and arterial disease, given the diminished pulses and the edema associated with venous changes of the legs.  Noninvasive studies including ABI's and venous ultrasound of the legs will be obtained and the patient will follow up with me to review these studies.  The patient should continue walking and begin a more formal exercise program. The patient should continue his antiplatelet therapy and aggressive  treatment of the lipid abnormalities.  The patient should begin wearing graduated compression socks 15-20 mmHg strength to control edema.        Leotis Pain 07/16/2019, 2:32 PM   This note was created with Dragon medical transcription system.  Any errors from dictation are unintentional.

## 2019-07-16 NOTE — Patient Instructions (Signed)

## 2019-07-16 NOTE — Assessment & Plan Note (Signed)
Although the causes of leg swelling are many, venous insufficiency in this instance is likely to be playing a major contributing role.  Compression stocking prescription given, leg elevation and activity recommended.  Return following her noninvasive studies.

## 2019-07-24 ENCOUNTER — Ambulatory Visit (INDEPENDENT_AMBULATORY_CARE_PROVIDER_SITE_OTHER): Payer: Medicare Other | Admitting: Nurse Practitioner

## 2019-07-24 ENCOUNTER — Encounter (INDEPENDENT_AMBULATORY_CARE_PROVIDER_SITE_OTHER): Payer: Medicare Other

## 2019-08-02 ENCOUNTER — Ambulatory Visit (INDEPENDENT_AMBULATORY_CARE_PROVIDER_SITE_OTHER): Payer: Medicare Other

## 2019-08-02 ENCOUNTER — Ambulatory Visit (INDEPENDENT_AMBULATORY_CARE_PROVIDER_SITE_OTHER): Payer: Medicare Other | Admitting: Nurse Practitioner

## 2019-08-02 ENCOUNTER — Encounter (INDEPENDENT_AMBULATORY_CARE_PROVIDER_SITE_OTHER): Payer: Self-pay | Admitting: Nurse Practitioner

## 2019-08-02 ENCOUNTER — Other Ambulatory Visit: Payer: Self-pay

## 2019-08-02 VITALS — BP 145/84 | HR 80 | Resp 16 | Wt 114.4 lb

## 2019-08-02 DIAGNOSIS — M7989 Other specified soft tissue disorders: Secondary | ICD-10-CM

## 2019-08-02 DIAGNOSIS — E782 Mixed hyperlipidemia: Secondary | ICD-10-CM | POA: Diagnosis not present

## 2019-08-02 DIAGNOSIS — M79604 Pain in right leg: Secondary | ICD-10-CM

## 2019-08-02 DIAGNOSIS — I83813 Varicose veins of bilateral lower extremities with pain: Secondary | ICD-10-CM | POA: Diagnosis not present

## 2019-08-02 DIAGNOSIS — M79605 Pain in left leg: Secondary | ICD-10-CM

## 2019-08-02 DIAGNOSIS — K219 Gastro-esophageal reflux disease without esophagitis: Secondary | ICD-10-CM | POA: Diagnosis not present

## 2019-08-06 ENCOUNTER — Encounter (INDEPENDENT_AMBULATORY_CARE_PROVIDER_SITE_OTHER): Payer: Self-pay | Admitting: Nurse Practitioner

## 2019-08-06 NOTE — Progress Notes (Signed)
SUBJECTIVE:  Patient ID: Diana Berg, female    DOB: 10-14-1945, 73 y.o.   MRN: [id number] Chief Complaint  Patient presents with  . Follow-up    ultrasound followup    HPI  Diana Berg is a 73 y.o. female that presents today for worsening of pain and leg swelling over the last few years.  Her right leg is worse than her left.  The patient describes a heavy and tight feeling in her legs and there is a general aching at times.  The patient describes what may be consistent with claudication-like symptoms in the right calf when she walks for distances.  The patient also does have prominent varicosities and for many years of them causing issues however recently she has noticed that they become bigger and have become more painful.  The patient does report that she has restless leg syndrome and feels it has been getting worse during this time as well.  She denies any fever, chills, nausea, vomiting or diarrhea.  Today the patient underwent bilateral ABIs.  The right lower extremity has an ABI 1.44 and the left ABI 1.42.  The patient has triphasic waveforms in the bilateral tibial arteries with normal toe waveforms bilaterally.  Today the patient also underwent a venous reflux study which revealed no evidence of DVT or superficial venous thrombosis bilaterally.  The patient has reflux in the common femoral vein and popliteal vein of the left lower extremity.  The patient has reflux in the bilateral great saphenous veins.  Past Medical History:  Diagnosis Date  . Barrett's esophagus   . Chronic venous insufficiency   . Degenerative joint disease   . Dysrhythmia 2009   PVCs  . GERD (gastroesophageal reflux disease)   . Labyrinthitis    no issues last 3 yrs  . OAB (overactive bladder)   . Osteoarthritis   . Varicose veins of both lower extremities   . Wears contact lenses     Past Surgical History:  Procedure Laterality Date  . ABDOMINAL HYSTERECTOMY  1976  . BREAST BIOPSY Right 1991   . BREAST CYST ASPIRATION Right 1990's   neg  . CATARACT EXTRACTION W/PHACO Left 07/03/2018   Procedure: CATARACT EXTRACTION PHACO AND INTRAOCULAR LENS PLACEMENT ([place])  LEFT;  Surgeon: Leandrew Koyanagi, MD;  Location: [place];  Service: Ophthalmology;  Laterality: Left;  PREFERS EARLY MORNING ARRIVAL CAN'T ARRIVE BEFORE [phone]  . CATARACT EXTRACTION W/PHACO Right 07/25/2018   Procedure: CATARACT EXTRACTION PHACO AND INTRAOCULAR LENS PLACEMENT ([facility])RIGHT TORIC LENS;  Surgeon: Leandrew Koyanagi, MD;  Location: [place];  Service: Ophthalmology;  Laterality: Right;  Requests early  . COLONOSCOPY WITH PROPOFOL N/A 12/18/2017   Procedure: COLONOSCOPY WITH PROPOFOL;  Surgeon: Lucilla Lame, MD;  Location: [place];  Service: Endoscopy;  Laterality: N/A;  . ESOPHAGOGASTRODUODENOSCOPY  2014  . ESOPHAGOGASTRODUODENOSCOPY (EGD) WITH PROPOFOL N/A 12/18/2017   Procedure: ESOPHAGOGASTRODUODENOSCOPY (EGD) WITH PROPOFOL;  Surgeon: Lucilla Lame, MD;  Location: [place];  Service: Endoscopy;  Laterality: N/A;  requests early  . KNEE ARTHROSCOPY Right 1995  . NASAL SEPTUM SURGERY  1970s  . POLYPECTOMY  12/18/2017   Procedure: POLYPECTOMY;  Surgeon: Lucilla Lame, MD;  Location: [place];  Service: Endoscopy;;    Social History   Socioeconomic History  . Marital status: Divorced    Spouse name: Not on file  . Number of children: Not on file  . Years of education: Not on file  . Highest education level: Not on  file  Occupational History  . Not on file  Social Needs  . Financial resource strain: Not on file  . Food insecurity    Worry: Not on file    Inability: Not on file  . Transportation needs    Medical: Not on file    Non-medical: Not on file  Tobacco Use  . Smoking status: Never Smoker  . Smokeless tobacco: Never Used  Substance and Sexual Activity  . Alcohol use: No    Frequency: Never  . Drug use: No  . Sexual activity: Not on  file  Lifestyle  . Physical activity    Days per week: Not on file    Minutes per session: Not on file  . Stress: Not on file  Relationships  . Social Herbalist on phone: Not on file    Gets together: Not on file    Attends religious service: Not on file    Active member of club or organization: Not on file    Attends meetings of clubs or organizations: Not on file    Relationship status: Not on file  . Intimate partner violence    Fear of current or ex partner: Not on file    Emotionally abused: Not on file    Physically abused: Not on file    Forced sexual activity: Not on file  Other Topics Concern  . Not on file  Social History Narrative  . Not on file    Family History  Problem Relation Age of Onset  . Heart disease Father   . Breast cancer Neg Hx     Allergies  Allergen Reactions  . Penicillin V     Pt does not recall having reaction  . Other Rash    "VERSAPIEN" per pt     Review of Systems   Review of Systems: Negative Unless Checked Constitutional: [] Weight loss  [] Fever  [] Chills Cardiac: [] Chest pain   []  Atrial Fibrillation  [] Palpitations   [] Shortness of breath when laying flat   [] Shortness of breath with exertion. [] Shortness of breath at rest Vascular:  [x] Pain in legs with walking   [] Pain in legs with standing [] Pain in legs when laying flat   [] Claudication    [] Pain in feet when laying flat    [] History of DVT   [] Phlebitis   [x] Swelling in legs   [x] Varicose veins   [] Non-healing ulcers Pulmonary:   [] Uses home oxygen   [] Productive cough   [] Hemoptysis   [] Wheeze  [] COPD   [] Asthma Neurologic:  [] Dizziness   [] Seizures  [] Blackouts [] History of stroke   [] History of TIA  [] Aphasia   [] Temporary Blindness   [] Weakness or numbness in arm   [] Weakness or numbness in leg Musculoskeletal:   [] Joint swelling   [] Joint pain   [] Low back pain  []  History of Knee Replacement [x] Arthritis [] back Surgeries  []  Spinal Stenosis    Hematologic:   [] Easy bruising  [] Easy bleeding   [] Hypercoagulable state   [] Anemic Gastrointestinal:  [] Diarrhea   [] Vomiting  [x] Gastroesophageal reflux/heartburn   [] Difficulty swallowing. [] Abdominal pain Genitourinary:  [] Chronic kidney disease   [] Difficult urination  [] Anuric   [] Blood in urine [] Frequent urination  [] Burning with urination   [] Hematuria Skin:  [] Rashes   [] Ulcers [] Wounds Psychological:  [] History of anxiety   []  History of major depression  []  Memory Difficulties      OBJECTIVE:   Physical Exam  BP (!) 145/84 (BP Location: Right Arm)   Pulse  80   Resp 16   Wt 114 lb 6.4 oz (51.9 kg)   BMI 21.62 kg/m   Gen: WD/WN, NAD Head: [place]/AT, No temporalis wasting.  Ear/Nose/Throat: Hearing grossly intact, nares w/o erythema or drainage Eyes: PER, EOMI, sclera nonicteric.  Neck: Supple, no masses.  No JVD.  Pulmonary:  Good air movement, no use of accessory muscles.  Cardiac: RRR Vascular: scattered varicosities present bilaterally.  Mild venous stasis changes to the legs bilaterally.  1+ soft pitting edema  Vessel Right Left  Radial Palpable Palpable  Dorsalis Pedis Palpable Palpable  Posterior Tibial Palpable Palpable   Gastrointestinal: soft, non-distended. No guarding/no peritoneal signs.  Musculoskeletal: M/S 5/5 throughout.  No deformity or atrophy.  Neurologic: Pain and light touch intact in extremities.  Symmetrical.  Speech is fluent. Motor exam as listed above. Psychiatric: Judgment intact, Mood & affect appropriate for pt's clinical situation. Dermatologic:  No Ulcers Noted.  No changes consistent with cellulitis. Lymph : No Cervical lymphadenopathy, no lichenification or skin changes of chronic lymphedema.       ASSESSMENT AND PLAN:  1. Varicose veins of both lower extremities with pain  Recommend:  The patient has large symptomatic varicose veins that are painful and associated with swelling.  I have had a long discussion with the patient regarding   varicose veins and why they cause symptoms.  Patient will begin wearing graduated compression stockings class 1 on a daily basis, beginning first thing in the morning and removing them in the evening. The patient is instructed specifically not to sleep in the stockings.    The patient  will also begin using over-the-counter analgesics such as Motrin 600 mg po TID to help control the symptoms.    In addition, behavioral modification including elevation during the day will be initiated.    Pending the results of these changes the  patient will be reevaluated in three months.   An  ultrasound of the venous system will be obtained.   Further plans will be based on the ultrasound results and whether conservative therapies are successful at eliminating the pain and swelling.   2. Mixed hyperlipidemia Continue statin as ordered and reviewed, no changes at this time   3. Gastroesophageal reflux disease without esophagitis Continue PPI as already ordered, this medication has been reviewed and there are no changes at this time.  Avoidence of caffeine and alcohol  Moderate elevation of the head of the bed    4. Pain in both lower extremities Recommend:  I do not find evidence of arterial vascular pathology that would explain the patient's symptoms  The patient has atypical pain symptoms for peripheral artery disease    Patient will follow-up with me on a PRN basis regarding PAD   Further work-up of her lower extremity pain is deferred to the primary service     Current Outpatient Medications on File Prior to Visit  Medication Sig Dispense Refill  . atorvastatin (LIPITOR) 10 MG tablet TK 1 T PO QD  3  . Biotin 300 MCG TABS Take by mouth.    . Calcium-Magnesium-Vitamin D (CITRACAL CALCIUM+D) [id number] MG-MG-UNIT TB24 Take by mouth.    . Chromium Picolinate 400 MCG TABS Take by mouth.    . Coenzyme Q10 100 MG capsule Take by mouth.    . famotidine (PEPCID) 20 MG tablet TK 1 T PO  Q NIGHT   3  . Lutein 20 MG TABS Take by mouth.    . vitamin B-12 (CYANOCOBALAMIN) 1000 MCG  tablet Take 1,000 mcg by mouth 2 (two) times a week.    . Cholecalciferol (VITAMIN D3) 400 units CAPS Take by mouth 2 (two) times a week.     No current facility-administered medications on file prior to visit.     There are no Patient Instructions on file for this visit. No follow-ups on file.   Kris Hartmann, NP  This note was completed with Sales executive.  Any errors are purely unintentional.

## 2019-10-14 ENCOUNTER — Other Ambulatory Visit: Payer: Self-pay | Admitting: Physician Assistant

## 2019-10-14 DIAGNOSIS — Z1231 Encounter for screening mammogram for malignant neoplasm of breast: Secondary | ICD-10-CM

## 2019-10-31 ENCOUNTER — Other Ambulatory Visit: Payer: Self-pay

## 2019-11-04 ENCOUNTER — Ambulatory Visit (INDEPENDENT_AMBULATORY_CARE_PROVIDER_SITE_OTHER): Payer: Medicare Other | Admitting: Nurse Practitioner

## 2019-11-08 ENCOUNTER — Ambulatory Visit
Admission: RE | Admit: 2019-11-08 | Discharge: 2019-11-08 | Disposition: A | Discharge status: Home / Self Care | Payer: Medicare PPO | Source: Ambulatory Visit | Attending: Physician Assistant | Admitting: Physician Assistant

## 2019-11-08 DIAGNOSIS — Z1231 Encounter for screening mammogram for malignant neoplasm of breast: Secondary | ICD-10-CM | POA: Diagnosis not present

## 2019-11-14 ENCOUNTER — Ambulatory Visit (INDEPENDENT_AMBULATORY_CARE_PROVIDER_SITE_OTHER): Payer: Medicare PPO | Admitting: Nurse Practitioner

## 2019-11-14 ENCOUNTER — Other Ambulatory Visit: Payer: Self-pay

## 2019-11-14 ENCOUNTER — Encounter (INDEPENDENT_AMBULATORY_CARE_PROVIDER_SITE_OTHER): Payer: Self-pay | Admitting: Nurse Practitioner

## 2019-11-14 VITALS — BP 120/74 | HR 85 | Resp 14 | Wt 113.0 lb

## 2019-11-14 DIAGNOSIS — M7989 Other specified soft tissue disorders: Secondary | ICD-10-CM | POA: Diagnosis not present

## 2019-11-14 DIAGNOSIS — I83813 Varicose veins of bilateral lower extremities with pain: Secondary | ICD-10-CM

## 2019-11-18 ENCOUNTER — Encounter (INDEPENDENT_AMBULATORY_CARE_PROVIDER_SITE_OTHER): Payer: Self-pay | Admitting: Nurse Practitioner

## 2019-11-19 NOTE — Progress Notes (Signed)
SUBJECTIVE:  Patient ID: Diana Berg, female    DOB: 1946/05/08, 74 y.o.   MRN: [id number] Chief Complaint  Patient presents with  . Follow-up    3 month follow up    HPI  Diana Berg is a 74 y.o. female The patient returns for followup evaluation 3 months after the initial visit. The patient continues to have pain in the lower extremities with dependency. The pain is lessened with elevation. Graduated compression stockings, Class I (20-30 mmHg), have been worn but the stockings do not eliminate the leg pain. Over-the-counter analgesics do not improve the symptoms. The degree of discomfort continues to interfere with daily activities. The patient notes the pain in the legs is causing problems with daily exercise, at the workplace and even with household activities and maintenance such as standing in the kitchen preparing meals and doing dishes.   Today the patient also underwent a venous reflux study which revealed no evidence of DVT or superficial venous thrombosis bilaterally.  The patient has reflux in the common femoral vein and popliteal vein of the left lower extremity.  The patient has reflux in the bilateral great saphenous veins.  Vein diameters on previous venous duplex range from 0.33 cm to 0.50.  Past Medical History:  Diagnosis Date  . Barrett's esophagus   . Chronic venous insufficiency   . Degenerative joint disease   . Dysrhythmia 2009   PVCs  . GERD (gastroesophageal reflux disease)   . Labyrinthitis    no issues last 3 yrs  . OAB (overactive bladder)   . Osteoarthritis   . Varicose veins of both lower extremities   . Wears contact lenses     Past Surgical History:  Procedure Laterality Date  . ABDOMINAL HYSTERECTOMY  1976  . BREAST CYST ASPIRATION Right 1990's   neg  . CATARACT EXTRACTION W/PHACO Left 07/03/2018   Procedure: CATARACT EXTRACTION PHACO AND INTRAOCULAR LENS PLACEMENT ([place])  LEFT;  Surgeon: Leandrew Koyanagi, MD;  Location: [place];  Service: Ophthalmology;  Laterality: Left;  PREFERS EARLY MORNING ARRIVAL CAN'T ARRIVE BEFORE [phone]  . CATARACT EXTRACTION W/PHACO Right 07/25/2018   Procedure: CATARACT EXTRACTION PHACO AND INTRAOCULAR LENS PLACEMENT ([facility])RIGHT TORIC LENS;  Surgeon: Leandrew Koyanagi, MD;  Location: [place];  Service: Ophthalmology;  Laterality: Right;  Requests early  . COLONOSCOPY WITH PROPOFOL N/A 12/18/2017   Procedure: COLONOSCOPY WITH PROPOFOL;  Surgeon: Lucilla Lame, MD;  Location: [place];  Service: Endoscopy;  Laterality: N/A;  . ESOPHAGOGASTRODUODENOSCOPY  2014  . ESOPHAGOGASTRODUODENOSCOPY (EGD) WITH PROPOFOL N/A 12/18/2017   Procedure: ESOPHAGOGASTRODUODENOSCOPY (EGD) WITH PROPOFOL;  Surgeon: Lucilla Lame, MD;  Location: [place];  Service: Endoscopy;  Laterality: N/A;  requests early  . KNEE ARTHROSCOPY Right 1995  . NASAL SEPTUM SURGERY  1970s  . POLYPECTOMY  12/18/2017   Procedure: POLYPECTOMY;  Surgeon: Lucilla Lame, MD;  Location: [place];  Service: Endoscopy;;    Social History   Socioeconomic History  . Marital status: Divorced    Spouse name: Not on file  . Number of children: Not on file  . Years of education: Not on file  . Highest education level: Not on file  Occupational History  . Not on file  Tobacco Use  . Smoking status: Never Smoker  . Smokeless tobacco: Never Used  Substance and Sexual Activity  . Alcohol use: No  . Drug use: No  . Sexual activity: Not on file  Other Topics Concern  . Not  on file  Social History Narrative  . Not on file   Social Determinants of Health   Financial Resource Strain:   . Difficulty of Paying Living Expenses: Not on file  Food Insecurity:   . Worried About Charity fundraiser in the Last Year: Not on file  . Ran Out of Food in the Last Year: Not on file  Transportation Needs:   . Lack of Transportation (Medical): Not on file  . Lack of Transportation (Non-Medical): Not  on file  Physical Activity:   . Days of Exercise per Week: Not on file  . Minutes of Exercise per Session: Not on file  Stress:   . Feeling of Stress : Not on file  Social Connections:   . Frequency of Communication with Friends and Family: Not on file  . Frequency of Social Gatherings with Friends and Family: Not on file  . Attends Religious Services: Not on file  . Active Member of Clubs or Organizations: Not on file  . Attends Archivist Meetings: Not on file  . Marital Status: Not on file  Intimate Partner Violence:   . Fear of Current or Ex-Partner: Not on file  . Emotionally Abused: Not on file  . Physically Abused: Not on file  . Sexually Abused: Not on file    Family History  Problem Relation Age of Onset  . Heart disease Father   . Breast cancer Neg Hx     Allergies  Allergen Reactions  . Penicillin V     Pt does not recall having reaction  . Other Rash    "VERSAPIEN" per pt     Review of Systems   Review of Systems: Negative Unless Checked Constitutional: [] Weight loss  [] Fever  [] Chills Cardiac: [] Chest pain   []  Atrial Fibrillation  [] Palpitations   [] Shortness of breath when laying flat   [] Shortness of breath with exertion. [] Shortness of breath at rest Vascular:  [] Pain in legs with walking   [] Pain in legs with standing [] Pain in legs when laying flat   [] Claudication    [] Pain in feet when laying flat    [] History of DVT   [] Phlebitis   [] Swelling in legs   [] Varicose veins   [] Non-healing ulcers Pulmonary:   [] Uses home oxygen   [] Productive cough   [] Hemoptysis   [] Wheeze  [] COPD   [] Asthma Neurologic:  [] Dizziness   [] Seizures  [] Blackouts [] History of stroke   [] History of TIA  [] Aphasia   [] Temporary Blindness   [] Weakness or numbness in arm   [] Weakness or numbness in leg Musculoskeletal:   [] Joint swelling   [] Joint pain   [] Low back pain  []  History of Knee Replacement [] Arthritis [] back Surgeries  []  Spinal Stenosis    Hematologic:   [] Easy bruising  [] Easy bleeding   [] Hypercoagulable state   [] Anemic Gastrointestinal:  [] Diarrhea   [] Vomiting  [] Gastroesophageal reflux/heartburn   [] Difficulty swallowing. [] Abdominal pain Genitourinary:  [] Chronic kidney disease   [] Difficult urination  [] Anuric   [] Blood in urine [] Frequent urination  [] Burning with urination   [] Hematuria Skin:  [] Rashes   [] Ulcers [] Wounds Psychological:  [] History of anxiety   []  History of major depression  []  Memory Difficulties      OBJECTIVE:   Physical Exam  BP 120/74 (BP Location: Right Arm)   Pulse 85   Resp 14   Wt 113 lb (51.3 kg)   BMI 21.35 kg/m   Gen: WD/WN, NAD Head: [place]/AT, No temporalis wasting.  Ear/Nose/Throat:  Hearing grossly intact, nares w/o erythema or drainage Eyes: PER, EOMI, sclera nonicteric.  Neck: Supple, no masses.  No JVD.  Pulmonary:  Good air movement, no use of accessory muscles.  Cardiac: RRR Vascular:  Scattered varicosities bilaterally.  Mild venous stasis changes to the legs bilaterally.  1+ soft pitting edema bilaterally Vessel Right Left  Radial Palpable Palpable  Dorsalis Pedis Palpable Palpable  Posterior Tibial Palpable Palpable   Gastrointestinal: soft, non-distended. No guarding/no peritoneal signs.  Musculoskeletal: M/S 5/5 throughout.  No deformity or atrophy.  Neurologic: Pain and light touch intact in extremities.  Symmetrical.  Speech is fluent. Motor exam as listed above. Psychiatric: Judgment intact, Mood & affect appropriate for pt's clinical situation. Dermatologic: No Venous rashes. No Ulcers Noted.  No changes consistent with cellulitis. Lymph : No Cervical lymphadenopathy, no lichenification or skin changes of chronic lymphedema.       ASSESSMENT AND PLAN:  1. Varicose veins of both lower extremities with pain Recommend  I have reviewed my previous  discussion with the patient regarding  varicose veins and why they cause symptoms. Patient will continue  wearing graduated  compression stockings class 1 on a daily basis, beginning first thing in the morning and removing them in the evening.    In addition, behavioral modification including elevation during the day was again discussed and this will continue.  The patient has utilized over the counter pain medications and has been exercising.  However, at this time conservative therapy has not alleviated the patient's symptoms of leg pain and swelling  Recommend: laser ablation of the right and  left great saphenous veins to eliminate the symptoms of pain and swelling of the lower extremities caused by the severe superficial venous reflux disease.   The patient would like time to speak with her spouse and consider whether to proceed. She will contact the office if she chooses to move forward   2. Swelling of limb Patient will continue with conservative therapy tactics outlined above.   Current Outpatient Medications on File Prior to Visit  Medication Sig Dispense Refill  . atorvastatin (LIPITOR) 10 MG tablet TK 1 T PO QD  3  . Biotin 300 MCG TABS Take by mouth.    . Calcium-Magnesium-Vitamin D (CITRACAL CALCIUM+D) [id number] MG-MG-UNIT TB24 Take by mouth.    . Cholecalciferol (VITAMIN D3) 400 units CAPS Take by mouth 2 (two) times a week.    . Chromium Picolinate 400 MCG TABS Take by mouth.    . Coenzyme Q10 100 MG capsule Take by mouth.    . famotidine (PEPCID) 20 MG tablet TK 1 T PO  Q NIGHT  3  . Lutein 20 MG TABS Take by mouth.    . vitamin B-12 (CYANOCOBALAMIN) 1000 MCG tablet Take 1,000 mcg by mouth 2 (two) times a week.     No current facility-administered medications on file prior to visit.    There are no Patient Instructions on file for this visit. No follow-ups on file.   Kris Hartmann, NP  This note was completed with Sales executive.  Any errors are purely unintentional.

## 2020-01-15 ENCOUNTER — Telehealth (INDEPENDENT_AMBULATORY_CARE_PROVIDER_SITE_OTHER): Payer: Self-pay

## 2020-01-15 NOTE — Telephone Encounter (Signed)
I dont have an order for her.

## 2020-01-15 NOTE — Telephone Encounter (Signed)
This  pt called and said for [place] to go a head an schedule her laser, When Markham Jordan  went to do so there was no order.

## 2020-01-15 NOTE — Telephone Encounter (Signed)
It looks like we saw the patient on 02/04 and she wanted to think it over and discuss it with her spouse.  She will need ablation of the right and left GSV with Dr. Lucky Cowboy

## 2020-01-15 NOTE — Telephone Encounter (Signed)
Pt left a vm on the nurse line saying she would like to have her laser procedure scheduled the pt didn't specify for which leg.

## 2020-02-21 ENCOUNTER — Ambulatory Visit (INDEPENDENT_AMBULATORY_CARE_PROVIDER_SITE_OTHER): Payer: Medicare PPO | Admitting: Vascular Surgery

## 2020-02-21 ENCOUNTER — Other Ambulatory Visit: Payer: Self-pay

## 2020-02-21 DIAGNOSIS — I83813 Varicose veins of bilateral lower extremities with pain: Secondary | ICD-10-CM | POA: Insufficient documentation

## 2020-02-21 DIAGNOSIS — I83811 Varicose veins of right lower extremities with pain: Secondary | ICD-10-CM

## 2020-02-21 NOTE — Progress Notes (Signed)
Diana Berg is a 74 y.o. female who presents with symptomatic venous reflux  Past Medical History:  Diagnosis Date  . Barrett's esophagus   . Chronic venous insufficiency   . Degenerative joint disease   . Dysrhythmia 2009   PVCs  . GERD (gastroesophageal reflux disease)   . Labyrinthitis    no issues last 3 yrs  . OAB (overactive bladder)   . Osteoarthritis   . Varicose veins of both lower extremities   . Wears contact lenses     Past Surgical History:  Procedure Laterality Date  . ABDOMINAL HYSTERECTOMY  1976  . BREAST CYST ASPIRATION Right 1990's   neg  . CATARACT EXTRACTION W/PHACO Left 07/03/2018   Procedure: CATARACT EXTRACTION PHACO AND INTRAOCULAR LENS PLACEMENT ([place])  LEFT;  Surgeon: Leandrew Koyanagi, MD;  Location: [place];  Service: Ophthalmology;  Laterality: Left;  PREFERS EARLY MORNING ARRIVAL CAN'T ARRIVE BEFORE [phone]  . CATARACT EXTRACTION W/PHACO Right 07/25/2018   Procedure: CATARACT EXTRACTION PHACO AND INTRAOCULAR LENS PLACEMENT ([facility])RIGHT TORIC LENS;  Surgeon: Leandrew Koyanagi, MD;  Location: [place];  Service: Ophthalmology;  Laterality: Right;  Requests early  . COLONOSCOPY WITH PROPOFOL N/A 12/18/2017   Procedure: COLONOSCOPY WITH PROPOFOL;  Surgeon: Lucilla Lame, MD;  Location: [place];  Service: Endoscopy;  Laterality: N/A;  . ESOPHAGOGASTRODUODENOSCOPY  2014  . ESOPHAGOGASTRODUODENOSCOPY (EGD) WITH PROPOFOL N/A 12/18/2017   Procedure: ESOPHAGOGASTRODUODENOSCOPY (EGD) WITH PROPOFOL;  Surgeon: Lucilla Lame, MD;  Location: [place];  Service: Endoscopy;  Laterality: N/A;  requests early  . KNEE ARTHROSCOPY Right 1995  . NASAL SEPTUM SURGERY  1970s  . POLYPECTOMY  12/18/2017   Procedure: POLYPECTOMY;  Surgeon: Lucilla Lame, MD;  Location: [facility];  Service: Endoscopy;;     Current Outpatient Medications:  .  atorvastatin (LIPITOR) 10 MG tablet, TK 1 T PO QD, Disp: , Rfl: 3 .   Biotin 300 MCG TABS, Take by mouth., Disp: , Rfl:  .  Calcium-Magnesium-Vitamin D (CITRACAL CALCIUM+D) 600-40-500 MG-MG-UNIT TB24, Take by mouth., Disp: , Rfl:  .  Chromium Picolinate 400 MCG TABS, Take by mouth., Disp: , Rfl:  .  Coenzyme Q10 100 MG capsule, Take by mouth., Disp: , Rfl:  .  famotidine (PEPCID) 20 MG tablet, TK 1 T PO  Q NIGHT, Disp: , Rfl: 3 .  Lutein 20 MG TABS, Take by mouth., Disp: , Rfl:  .  vitamin B-12 (CYANOCOBALAMIN) 1000 MCG tablet, Take 1,000 mcg by mouth 2 (two) times a week., Disp: , Rfl:  .  Cholecalciferol (VITAMIN D3) 400 units CAPS, Take by mouth 2 (two) times a week., Disp: , Rfl:   Allergies  Allergen Reactions  . Penicillin V     Pt does not recall having reaction  . Other Rash    "VERSAPIEN" per pt     Varicose veins of leg with pain, bilateral     PLAN: The patient's right lower extremity was sterilely prepped and draped. The ultrasound machine was used to visualize the saphenous vein throughout its course. A segment in the upper calf was selected for access. The saphenous vein was accessed without difficulty using ultrasound guidance with a micropuncture needle. A 0.018 wire was then placed beyond the saphenofemoral junction and the needle was removed. The 65 cm sheath was then placed over the wire and the wire and dilator were removed. The laser fiber was then placed through the sheath and its tip was placed approximately 4-5 centimeters below the saphenofemoral  junction. Tumescent anesthesia was then created with a dilute lidocaine solution. Laser energy was then delivered with constant withdrawal of the sheath and laser fiber. Approximately 1289 joules of energy were delivered over a length of 33 centimeters using a 1470 Hz VenaCure machine at 7 W. Sterile dressings were placed. The patient tolerated the procedure well without obvious complications.   Follow-up in 1 week with post-laser duplex.

## 2020-02-24 ENCOUNTER — Other Ambulatory Visit: Payer: Self-pay

## 2020-02-24 ENCOUNTER — Other Ambulatory Visit (INDEPENDENT_AMBULATORY_CARE_PROVIDER_SITE_OTHER): Payer: Self-pay | Admitting: Vascular Surgery

## 2020-02-24 ENCOUNTER — Ambulatory Visit (INDEPENDENT_AMBULATORY_CARE_PROVIDER_SITE_OTHER): Payer: Medicare PPO

## 2020-02-24 DIAGNOSIS — I83813 Varicose veins of bilateral lower extremities with pain: Secondary | ICD-10-CM | POA: Diagnosis not present

## 2020-03-13 ENCOUNTER — Other Ambulatory Visit: Payer: Self-pay

## 2020-03-13 ENCOUNTER — Ambulatory Visit (INDEPENDENT_AMBULATORY_CARE_PROVIDER_SITE_OTHER): Payer: Medicare PPO | Admitting: Vascular Surgery

## 2020-03-13 VITALS — BP 116/76 | HR 88 | Ht 62.0 in | Wt 112.0 lb

## 2020-03-13 DIAGNOSIS — I83812 Varicose veins of left lower extremities with pain: Secondary | ICD-10-CM

## 2020-03-13 DIAGNOSIS — I83813 Varicose veins of bilateral lower extremities with pain: Secondary | ICD-10-CM

## 2020-03-13 NOTE — Progress Notes (Signed)
Diana Berg is a 74 y.o. female who presents with symptomatic venous reflux  Past Medical History:  Diagnosis Date  . Barrett's esophagus   . Chronic venous insufficiency   . Degenerative joint disease   . Dysrhythmia 2009   PVCs  . GERD (gastroesophageal reflux disease)   . Labyrinthitis    no issues last 3 yrs  . OAB (overactive bladder)   . Osteoarthritis   . Varicose veins of both lower extremities   . Wears contact lenses     Past Surgical History:  Procedure Laterality Date  . ABDOMINAL HYSTERECTOMY  1976  . BREAST CYST ASPIRATION Right 1990's   neg  . CATARACT EXTRACTION W/PHACO Left 07/03/2018   Procedure: CATARACT EXTRACTION PHACO AND INTRAOCULAR LENS PLACEMENT ([place])  LEFT;  Surgeon: Leandrew Koyanagi, MD;  Location: [place];  Service: Ophthalmology;  Laterality: Left;  PREFERS EARLY MORNING ARRIVAL CAN'T ARRIVE BEFORE [phone]  . CATARACT EXTRACTION W/PHACO Right 07/25/2018   Procedure: CATARACT EXTRACTION PHACO AND INTRAOCULAR LENS PLACEMENT ([facility])RIGHT TORIC LENS;  Surgeon: Leandrew Koyanagi, MD;  Location: [place];  Service: Ophthalmology;  Laterality: Right;  Requests early  . COLONOSCOPY WITH PROPOFOL N/A 12/18/2017   Procedure: COLONOSCOPY WITH PROPOFOL;  Surgeon: Lucilla Lame, MD;  Location: [place];  Service: Endoscopy;  Laterality: N/A;  . ESOPHAGOGASTRODUODENOSCOPY  2014  . ESOPHAGOGASTRODUODENOSCOPY (EGD) WITH PROPOFOL N/A 12/18/2017   Procedure: ESOPHAGOGASTRODUODENOSCOPY (EGD) WITH PROPOFOL;  Surgeon: Lucilla Lame, MD;  Location: [place];  Service: Endoscopy;  Laterality: N/A;  requests early  . KNEE ARTHROSCOPY Right 1995  . NASAL SEPTUM SURGERY  1970s  . POLYPECTOMY  12/18/2017   Procedure: POLYPECTOMY;  Surgeon: Lucilla Lame, MD;  Location: [facility];  Service: Endoscopy;;     Current Outpatient Medications:  .  atorvastatin (LIPITOR) 10 MG tablet, TK 1 T PO QD, Disp: , Rfl: 3 .   Biotin 300 MCG TABS, Take by mouth., Disp: , Rfl:  .  Calcium-Magnesium-Vitamin D (CITRACAL CALCIUM+D) 600-40-500 MG-MG-UNIT TB24, Take by mouth., Disp: , Rfl:  .  Cholecalciferol (VITAMIN D3) 400 units CAPS, Take by mouth 2 (two) times a week., Disp: , Rfl:  .  Chromium Picolinate 400 MCG TABS, Take by mouth., Disp: , Rfl:  .  Coenzyme Q10 100 MG capsule, Take by mouth., Disp: , Rfl:  .  famotidine (PEPCID) 20 MG tablet, TK 1 T PO  Q NIGHT, Disp: , Rfl: 3 .  Lutein 20 MG TABS, Take by mouth., Disp: , Rfl:  .  vitamin B-12 (CYANOCOBALAMIN) 1000 MCG tablet, Take 1,000 mcg by mouth 2 (two) times a week., Disp: , Rfl:   Allergies  Allergen Reactions  . Penicillin V     Pt does not recall having reaction  . Other Rash    "VERSAPIEN" per pt     Varicose veins of leg with pain, bilateral     PLAN: The patient's left lower extremity was sterilely prepped and draped. The ultrasound machine was used to visualize the saphenous vein throughout its course. A segment in the mid to upper calf was selected for access. The saphenous vein was accessed without difficulty using ultrasound guidance with a micropuncture needle. A 0.018 wire was then placed beyond the saphenofemoral junction and the needle was removed. The 65 cm sheath was then placed over the wire and the wire and dilator were removed. The laser fiber was then placed through the sheath and its tip was placed approximately 4-5 centimeters below  the saphenofemoral junction. Tumescent anesthesia was then created with a dilute lidocaine solution. Laser energy was then delivered with constant withdrawal of the sheath and laser fiber. Approximately 1296 joules of energy were delivered over a length of 35 centimeters using a 1470 Hz VenaCure machine at 7 W. Sterile dressings were placed. The patient tolerated the procedure well without obvious complications.   Follow-up in 1 week with post-laser duplex.

## 2020-03-16 ENCOUNTER — Ambulatory Visit (INDEPENDENT_AMBULATORY_CARE_PROVIDER_SITE_OTHER): Payer: Medicare PPO

## 2020-03-16 ENCOUNTER — Other Ambulatory Visit: Payer: Self-pay

## 2020-03-16 DIAGNOSIS — I83813 Varicose veins of bilateral lower extremities with pain: Secondary | ICD-10-CM

## 2020-04-14 ENCOUNTER — Ambulatory Visit (INDEPENDENT_AMBULATORY_CARE_PROVIDER_SITE_OTHER): Payer: Medicare PPO | Admitting: Vascular Surgery

## 2020-04-21 ENCOUNTER — Encounter (INDEPENDENT_AMBULATORY_CARE_PROVIDER_SITE_OTHER): Payer: Self-pay | Admitting: Nurse Practitioner

## 2020-04-21 ENCOUNTER — Ambulatory Visit (INDEPENDENT_AMBULATORY_CARE_PROVIDER_SITE_OTHER): Payer: Medicare PPO | Admitting: Nurse Practitioner

## 2020-04-21 ENCOUNTER — Other Ambulatory Visit: Payer: Self-pay

## 2020-04-21 VITALS — BP 124/76 | HR 87 | Ht 62.0 in | Wt 113.0 lb

## 2020-04-21 DIAGNOSIS — M7989 Other specified soft tissue disorders: Secondary | ICD-10-CM

## 2020-04-21 DIAGNOSIS — I83813 Varicose veins of bilateral lower extremities with pain: Secondary | ICD-10-CM | POA: Diagnosis not present

## 2020-04-21 NOTE — Progress Notes (Signed)
Subjective:    Patient ID: LOY LITTLE, female    DOB: [birth year], 74 y.o.   MRN: [id number] Chief Complaint  Patient presents with  . Follow-up    4 wk post laser    The patient returns to the office for followup status post laser ablation of the right and left great saphenous vein on 02/21/2020 and 03/13/2020 respectively.  The patient note significant improvement in the lower extremity pain but not resolution of the symptoms. The patient notes multiple residual varicosities bilaterally which continued to hurt with dependent positions and remained tender to palpation. The patient's swelling is minimally from preoperative status. The patient continues to wear graduated compression stockings on a daily basis but these are not eliminating the pain and discomfort. The patient continues to use over-the-counter anti-inflammatory medications to treat the pain and related symptoms but this has not given the patient relief. The patient notes the pain in the lower extremities is causing problems with daily exercise, problems at work and even with household activities such as preparing meals and doing dishes.  The patient is otherwise done well and there have been no complications related to the laser procedure or interval changes in the patient's overall   Post laser ultrasound shows successful ablation of the bilateral great saphenous veins.   Review of Systems  Cardiovascular: Positive for leg swelling.       Some residual soreness over varicosities  All other systems reviewed and are negative.      Objective:   Physical Exam Vitals reviewed.  HENT:     Head: Normocephalic.  Cardiovascular:     Pulses: Normal pulses.     Comments: Remaining varicosities present bilaterally Pulmonary:     Effort: Pulmonary effort is normal.  Musculoskeletal:        General: Normal range of motion.  Skin:    General: Skin is warm and dry.  Neurological:     Mental Status: She is alert and oriented  to person, place, and time.  Psychiatric:        Mood and Affect: Mood normal.        Behavior: Behavior normal.        Thought Content: Thought content normal.        Judgment: Judgment normal.     BP 124/76   Pulse 87   Ht 5\' 2"  (1.575 m)   Wt 113 lb (51.3 kg)   BMI 20.67 kg/m   Past Medical History:  Diagnosis Date  . Barrett's esophagus   . Chronic venous insufficiency   . Degenerative joint disease   . Dysrhythmia 2009   PVCs  . GERD (gastroesophageal reflux disease)   . Labyrinthitis    no issues last 3 yrs  . OAB (overactive bladder)   . Osteoarthritis   . Varicose veins of both lower extremities   . Wears contact lenses     Social History   Socioeconomic History  . Marital status: Divorced    Spouse name: Not on file  . Number of children: Not on file  . Years of education: Not on file  . Highest education level: Not on file  Occupational History  . Not on file  Tobacco Use  . Smoking status: Never Smoker  . Smokeless tobacco: Never Used  Vaping Use  . Vaping Use: Never used  Substance and Sexual Activity  . Alcohol use: No  . Drug use: No  . Sexual activity: Not on file  Other Topics Concern  .  Not on file  Social History Narrative  . Not on file   Social Determinants of Health   Financial Resource Strain:   . Difficulty of Paying Living Expenses:   Food Insecurity:   . Worried About Charity fundraiser in the Last Year:   . Arboriculturist in the Last Year:   Transportation Needs:   . Film/video editor (Medical):   Marland Kitchen Lack of Transportation (Non-Medical):   Physical Activity:   . Days of Exercise per Week:   . Minutes of Exercise per Session:   Stress:   . Feeling of Stress :   Social Connections:   . Frequency of Communication with Friends and Family:   . Frequency of Social Gatherings with Friends and Family:   . Attends Religious Services:   . Active Member of Clubs or Organizations:   . Attends Archivist  Meetings:   Marland Kitchen Marital Status:   Intimate Partner Violence:   . Fear of Current or Ex-Partner:   . Emotionally Abused:   Marland Kitchen Physically Abused:   . Sexually Abused:     Past Surgical History:  Procedure Laterality Date  . ABDOMINAL HYSTERECTOMY  1976  . BREAST CYST ASPIRATION Right 1990's   neg  . CATARACT EXTRACTION W/PHACO Left 07/03/2018   Procedure: CATARACT EXTRACTION PHACO AND INTRAOCULAR LENS PLACEMENT ([place])  LEFT;  Surgeon: Leandrew Koyanagi, MD;  Location: [place];  Service: Ophthalmology;  Laterality: Left;  PREFERS EARLY MORNING ARRIVAL CAN'T ARRIVE BEFORE [phone]  . CATARACT EXTRACTION W/PHACO Right 07/25/2018   Procedure: CATARACT EXTRACTION PHACO AND INTRAOCULAR LENS PLACEMENT ([facility])RIGHT TORIC LENS;  Surgeon: Leandrew Koyanagi, MD;  Location: [place];  Service: Ophthalmology;  Laterality: Right;  Requests early  . COLONOSCOPY WITH PROPOFOL N/A 12/18/2017   Procedure: COLONOSCOPY WITH PROPOFOL;  Surgeon: Lucilla Lame, MD;  Location: [place];  Service: Endoscopy;  Laterality: N/A;  . ESOPHAGOGASTRODUODENOSCOPY  2014  . ESOPHAGOGASTRODUODENOSCOPY (EGD) WITH PROPOFOL N/A 12/18/2017   Procedure: ESOPHAGOGASTRODUODENOSCOPY (EGD) WITH PROPOFOL;  Surgeon: Lucilla Lame, MD;  Location: [place];  Service: Endoscopy;  Laterality: N/A;  requests early  . KNEE ARTHROSCOPY Right 1995  . NASAL SEPTUM SURGERY  1970s  . POLYPECTOMY  12/18/2017   Procedure: POLYPECTOMY;  Surgeon: Lucilla Lame, MD;  Location: [place];  Service: Endoscopy;;    Family History  Problem Relation Age of Onset  . Heart disease Father   . Breast cancer Neg Hx     Allergies  Allergen Reactions  . Penicillin V     Pt does not recall having reaction  . Other Rash    "VERSAPIEN" per pt       Assessment & Plan:   1. Varicose veins of leg with pain, bilateral Recommend:  The patient has had successful ablation of the previously incompetent  saphenous venous system but still has persistent symptoms of pain and swelling that are having a negative impact on daily life and daily activities.  Patient should undergo injection sclerotherapy to treat the residual varicosities.  The risks, benefits and alternative therapies were reviewed in detail with the patient.  All questions were answered.  The patient agrees to proceed with sclerotherapy at their convenience.  The patient will continue wearing the graduated compression stockings and using the over-the-counter pain medications to treat her symptoms.       2. Swelling of limb  Patient will continue wearing graduated compression stockings on a daily basis, beginning first thing  in the morning and removing them in the evening. The patient is instructed specifically not to sleep in the stockings.    The patient  will also begin using over-the-counter analgesics  to help control the symptoms as needed.    In addition, behavioral modification including elevation during the day will be continued , utilizing a recliner was recommended.  The patient is also instructed to continue exercising such as walking 4-5 times per week.  The Patient will follow up PRN if the symptoms worsen.   Current Outpatient Medications on File Prior to Visit  Medication Sig Dispense Refill  . atorvastatin (LIPITOR) 10 MG tablet TK 1 T PO QD  3  . Biotin 300 MCG TABS Take by mouth.    . Calcium-Magnesium-Vitamin D (CITRACAL CALCIUM+D) [id number] MG-MG-UNIT TB24 Take by mouth.    . Cholecalciferol (VITAMIN D3) 400 units CAPS Take by mouth 2 (two) times a week.    . Chromium Picolinate 400 MCG TABS Take by mouth.    . Coenzyme Q10 100 MG capsule Take by mouth.    . famotidine (PEPCID) 20 MG tablet TK 1 T PO  Q NIGHT  3  . Lutein 20 MG TABS Take by mouth.    . vitamin B-12 (CYANOCOBALAMIN) 1000 MCG tablet Take 1,000 mcg by mouth 2 (two) times a week.     No current facility-administered medications on file  prior to visit.    There are no Patient Instructions on file for this visit. No follow-ups on file.   Kris Hartmann, NP

## 2020-05-20 ENCOUNTER — Other Ambulatory Visit: Payer: Self-pay

## 2020-05-20 ENCOUNTER — Encounter (INDEPENDENT_AMBULATORY_CARE_PROVIDER_SITE_OTHER): Payer: Self-pay | Admitting: Vascular Surgery

## 2020-05-20 ENCOUNTER — Ambulatory Visit (INDEPENDENT_AMBULATORY_CARE_PROVIDER_SITE_OTHER): Payer: Medicare PPO | Admitting: Vascular Surgery

## 2020-05-20 VITALS — BP 125/67 | HR 86 | Resp 16 | Wt 113.6 lb

## 2020-05-20 DIAGNOSIS — I83813 Varicose veins of bilateral lower extremities with pain: Secondary | ICD-10-CM

## 2020-05-20 NOTE — Progress Notes (Signed)
Varicose veins of bilateral  lower extremity with inflammation (454.1  I83.10) Current Plans   Indication: Patient presents with symptomatic varicose veins of the bilateral  lower extremity.   Procedure: Sclerotherapy using hypertonic saline mixed with 1% Lidocaine was performed on the bilateral lower extremity. Compression wraps were placed. The patient tolerated the procedure well. 

## 2020-05-21 ENCOUNTER — Telehealth (INDEPENDENT_AMBULATORY_CARE_PROVIDER_SITE_OTHER): Payer: Self-pay

## 2020-05-21 NOTE — Telephone Encounter (Signed)
Pt called and said that she has a round puffy spot on her right leg from sclero the day before about the size of a fifty cent piece and wanted to know if it was normal . I made the pt aware per the NP that this was normal and that if she has any pain she can take IB profen

## 2020-06-17 ENCOUNTER — Encounter (INDEPENDENT_AMBULATORY_CARE_PROVIDER_SITE_OTHER): Payer: Self-pay | Admitting: Vascular Surgery

## 2020-06-17 ENCOUNTER — Ambulatory Visit (INDEPENDENT_AMBULATORY_CARE_PROVIDER_SITE_OTHER): Payer: Medicare PPO | Admitting: Vascular Surgery

## 2020-06-17 ENCOUNTER — Other Ambulatory Visit: Payer: Self-pay

## 2020-06-17 VITALS — BP 149/75 | HR 97 | Resp 18 | Ht 62.0 in | Wt 112.0 lb

## 2020-06-17 DIAGNOSIS — I83813 Varicose veins of bilateral lower extremities with pain: Secondary | ICD-10-CM

## 2020-06-17 NOTE — Progress Notes (Signed)
Varicose veins of bilateral  lower extremity with inflammation (454.1  I83.10) Current Plans   Indication: Patient presents with symptomatic varicose veins of the bilateral  lower extremity.   Procedure: Sclerotherapy using hypertonic saline mixed with 1% Lidocaine was performed on the bilateral lower extremity. Compression wraps were placed. The patient tolerated the procedure well. 

## 2020-07-15 ENCOUNTER — Ambulatory Visit (INDEPENDENT_AMBULATORY_CARE_PROVIDER_SITE_OTHER): Payer: Medicare PPO | Admitting: Vascular Surgery

## 2020-07-15 ENCOUNTER — Other Ambulatory Visit: Payer: Self-pay

## 2020-07-15 ENCOUNTER — Encounter (INDEPENDENT_AMBULATORY_CARE_PROVIDER_SITE_OTHER): Payer: Self-pay | Admitting: Vascular Surgery

## 2020-07-15 VITALS — BP 133/83 | HR 84 | Ht 62.0 in | Wt 111.0 lb

## 2020-07-15 DIAGNOSIS — I83813 Varicose veins of bilateral lower extremities with pain: Secondary | ICD-10-CM

## 2020-07-15 DIAGNOSIS — K222 Esophageal obstruction: Secondary | ICD-10-CM | POA: Insufficient documentation

## 2020-07-15 NOTE — Progress Notes (Signed)
Varicose veins of bilateral  lower extremity with inflammation (454.1  I83.10) Current Plans   Indication: Patient presents with symptomatic varicose veins of the bilateral  lower extremity.   Procedure: Sclerotherapy using hypertonic saline mixed with 1% Lidocaine was performed on the bilateral lower extremity. Compression wraps were placed. The patient tolerated the procedure well. 

## 2020-08-12 ENCOUNTER — Ambulatory Visit (INDEPENDENT_AMBULATORY_CARE_PROVIDER_SITE_OTHER): Payer: Medicare PPO | Admitting: Vascular Surgery

## 2020-09-16 ENCOUNTER — Ambulatory Visit (INDEPENDENT_AMBULATORY_CARE_PROVIDER_SITE_OTHER): Payer: Medicare PPO | Admitting: Vascular Surgery

## 2020-09-29 ENCOUNTER — Other Ambulatory Visit: Payer: Self-pay | Admitting: Physician Assistant

## 2020-09-29 DIAGNOSIS — Z1231 Encounter for screening mammogram for malignant neoplasm of breast: Secondary | ICD-10-CM

## 2020-11-04 ENCOUNTER — Ambulatory Visit
Admission: RE | Admit: 2020-11-04 | Discharge: 2020-11-04 | Disposition: A | Discharge status: Home / Self Care | Payer: Medicare PPO | Source: Ambulatory Visit | Attending: Physician Assistant | Admitting: Physician Assistant

## 2020-11-04 ENCOUNTER — Other Ambulatory Visit: Payer: Self-pay

## 2020-11-04 DIAGNOSIS — Z1231 Encounter for screening mammogram for malignant neoplasm of breast: Secondary | ICD-10-CM | POA: Insufficient documentation

## 2021-01-14 ENCOUNTER — Telehealth (INDEPENDENT_AMBULATORY_CARE_PROVIDER_SITE_OTHER): Payer: Self-pay | Admitting: Vascular Surgery

## 2021-01-14 NOTE — Telephone Encounter (Signed)
Patient can be schedule for bilateral reflux see fallon or dew

## 2021-01-14 NOTE — Telephone Encounter (Signed)
Patient called in stating she having intermitting pain in her right and left leg, more in her left leg.  Patient state she had a laser ablation last year around May/June of 2021.  Patient is questioning if she needs to come in for an appointment.  Please advise.

## 2021-02-09 ENCOUNTER — Other Ambulatory Visit (INDEPENDENT_AMBULATORY_CARE_PROVIDER_SITE_OTHER): Payer: Self-pay | Admitting: Nurse Practitioner

## 2021-02-09 DIAGNOSIS — M79605 Pain in left leg: Secondary | ICD-10-CM

## 2021-02-09 DIAGNOSIS — M79604 Pain in right leg: Secondary | ICD-10-CM

## 2021-02-09 DIAGNOSIS — Z9889 Other specified postprocedural states: Secondary | ICD-10-CM

## 2021-02-10 ENCOUNTER — Ambulatory Visit (INDEPENDENT_AMBULATORY_CARE_PROVIDER_SITE_OTHER): Payer: Medicare PPO | Admitting: Nurse Practitioner

## 2021-02-10 ENCOUNTER — Ambulatory Visit (INDEPENDENT_AMBULATORY_CARE_PROVIDER_SITE_OTHER): Payer: Medicare PPO

## 2021-02-10 ENCOUNTER — Other Ambulatory Visit (INDEPENDENT_AMBULATORY_CARE_PROVIDER_SITE_OTHER): Payer: Self-pay | Admitting: Nurse Practitioner

## 2021-02-10 ENCOUNTER — Encounter (INDEPENDENT_AMBULATORY_CARE_PROVIDER_SITE_OTHER): Payer: Self-pay | Admitting: Nurse Practitioner

## 2021-02-10 ENCOUNTER — Other Ambulatory Visit: Payer: Self-pay

## 2021-02-10 VITALS — BP 146/65 | HR 76 | Resp 16 | Wt 111.0 lb

## 2021-02-10 DIAGNOSIS — I83813 Varicose veins of bilateral lower extremities with pain: Secondary | ICD-10-CM | POA: Diagnosis not present

## 2021-02-10 DIAGNOSIS — M79605 Pain in left leg: Secondary | ICD-10-CM | POA: Diagnosis not present

## 2021-02-10 DIAGNOSIS — M79606 Pain in leg, unspecified: Secondary | ICD-10-CM

## 2021-02-10 DIAGNOSIS — Z9889 Other specified postprocedural states: Secondary | ICD-10-CM

## 2021-02-10 DIAGNOSIS — M79604 Pain in right leg: Secondary | ICD-10-CM

## 2021-02-10 DIAGNOSIS — E785 Hyperlipidemia, unspecified: Secondary | ICD-10-CM | POA: Diagnosis not present

## 2021-02-10 NOTE — Progress Notes (Signed)
Subjective:    Patient ID: Diana Berg, female    DOB: 06-15-1946, 75 y.o.   MRN: [id number] Chief Complaint  Patient presents with  . Follow-up    Ultrasound follow up    Diana Berg is a 75 year old woman that presents today for evaluation of left leg pain.  The patient has previously had an endovenous ablation on her bilateral great saphenous veins.  However she notes that recently she has had pain in her right knee and calf area.  She notes that her left leg has pain and then following restitution to get better.  The pain does not happen every day but it is a sharp sensation.  She denies any fever or chills.  She denies any open wounds or ulcerations.  The patient has previously had laparoscopic procedures done on her left knee several years ago.  Today noninvasive studies show no evidence of DVT or superficial venous thrombosis bilaterally.  The patient does have evidence of deep venous insufficiency in the left lower extremity.  Left great saphenous vein appears compressible and patent with no evidence of reflux.  The right great saphenous vein has reflux in the proximal great saphenous vein but this is consistent with post endovenous ablation.  The patient also underwent ABIs.  She has an ABI of 1.11 on the left and 1.15 on the right with triphasic tibial artery waveforms bilaterally with good toe waveforms.   Review of Systems  Musculoskeletal: Positive for arthralgias.  All other systems reviewed and are negative.      Objective:   Physical Exam Vitals reviewed.  HENT:     Head: Normocephalic.  Cardiovascular:     Rate and Rhythm: Normal rate.     Pulses: Normal pulses.  Pulmonary:     Effort: Pulmonary effort is normal.  Musculoskeletal:        General: Tenderness present.  Neurological:     Mental Status: She is alert and oriented to person, place, and time.  Psychiatric:        Mood and Affect: Mood normal.        Behavior: Behavior normal.        Thought  Content: Thought content normal.        Judgment: Judgment normal.     BP (!) 146/65 (BP Location: Left Arm)   Pulse 76   Resp 16   Wt 111 lb (50.3 kg)   BMI 20.30 kg/m   Past Medical History:  Diagnosis Date  . Barrett's esophagus   . Chronic venous insufficiency   . Degenerative joint disease   . Dysrhythmia 2009   PVCs  . GERD (gastroesophageal reflux disease)   . Labyrinthitis    no issues last 3 yrs  . OAB (overactive bladder)   . Osteoarthritis   . Varicose veins of both lower extremities   . Wears contact lenses     Social History   Socioeconomic History  . Marital status: Divorced    Spouse name: Not on file  . Number of children: Not on file  . Years of education: Not on file  . Highest education level: Not on file  Occupational History  . Not on file  Tobacco Use  . Smoking status: Never Smoker  . Smokeless tobacco: Never Used  Vaping Use  . Vaping Use: Never used  Substance and Sexual Activity  . Alcohol use: No  . Drug use: No  . Sexual activity: Not on file  Other Topics Concern  .  Not on file  Social History Narrative  . Not on file   Social Determinants of Health   Financial Resource Strain: Not on file  Food Insecurity: Not on file  Transportation Needs: Not on file  Physical Activity: Not on file  Stress: Not on file  Social Connections: Not on file  Intimate Partner Violence: Not on file    Past Surgical History:  Procedure Laterality Date  . ABDOMINAL HYSTERECTOMY  1976  . BREAST CYST ASPIRATION Right 1990's   neg  . CATARACT EXTRACTION W/PHACO Left 07/03/2018   Procedure: CATARACT EXTRACTION PHACO AND INTRAOCULAR LENS PLACEMENT ([place])  LEFT;  Surgeon: Leandrew Koyanagi, MD;  Location: [place];  Service: Ophthalmology;  Laterality: Left;  PREFERS EARLY MORNING ARRIVAL CAN'T ARRIVE BEFORE [phone]  . CATARACT EXTRACTION W/PHACO Right 07/25/2018   Procedure: CATARACT EXTRACTION PHACO AND INTRAOCULAR LENS PLACEMENT  ([facility])RIGHT TORIC LENS;  Surgeon: Leandrew Koyanagi, MD;  Location: [place];  Service: Ophthalmology;  Laterality: Right;  Requests early  . COLONOSCOPY WITH PROPOFOL N/A 12/18/2017   Procedure: COLONOSCOPY WITH PROPOFOL;  Surgeon: Lucilla Lame, MD;  Location: [place];  Service: Endoscopy;  Laterality: N/A;  . ESOPHAGOGASTRODUODENOSCOPY  2014  . ESOPHAGOGASTRODUODENOSCOPY (EGD) WITH PROPOFOL N/A 12/18/2017   Procedure: ESOPHAGOGASTRODUODENOSCOPY (EGD) WITH PROPOFOL;  Surgeon: Lucilla Lame, MD;  Location: [place];  Service: Endoscopy;  Laterality: N/A;  requests early  . KNEE ARTHROSCOPY Right 1995  . NASAL SEPTUM SURGERY  1970s  . POLYPECTOMY  12/18/2017   Procedure: POLYPECTOMY;  Surgeon: Lucilla Lame, MD;  Location: [place];  Service: Endoscopy;;    Family History  Problem Relation Age of Onset  . Heart disease Father   . Breast cancer Neg Hx     Allergies  Allergen Reactions  . Penicillin V     Pt does not recall having reaction  . Other Rash    "VERSAPIEN" per pt    No flowsheet data found.    CMP  No results found for: NA, K, CL, CO2, GLUCOSE, BUN, CREATININE, CALCIUM, PROT, ALBUMIN, AST, ALT, ALKPHOS, BILITOT, GFRNONAA, GFRAA   No results found.     Assessment & Plan:   1. Bilateral leg pain Based upon the noninvasive studies today as well as the patient's description of pain, it is likely related to arthritis or another musculoskeletal cause.  The patient has not had any recannulization of her endovenous laser ablation.  Also the pain is centered mainly in the knee and calf area and the patient has previously had intervention on this knee several years ago.  The patient is advised to follow-up with PCP for any other work-up and evaluation.  2. Varicose veins of leg with pain, bilateral Previous endovascular intervention with only bilateral varicose veins are still intact.  Patient is advised to continue with conservative  therapy of utilizing compression socks, elevation and exercise when able.  We will continue to follow the patient on an as-needed basis.  3. Hyperlipidemia, unspecified hyperlipidemia type Continue statin as ordered and reviewed, no changes at this time    Current Outpatient Medications on File Prior to Visit  Medication Sig Dispense Refill  . atorvastatin (LIPITOR) 10 MG tablet TK 1 T PO QD  3  . Biotin 300 MCG TABS Take by mouth.    . Calcium-Magnesium-Vitamin D [phone] MG-MG-UNIT TB24 Take by mouth.    . Chromium Picolinate 400 MCG TABS Take by mouth.    . Coenzyme Q10 100 MG capsule Take  by mouth.    . famotidine (PEPCID) 20 MG tablet TK 1 T PO  Q NIGHT  3  . Lutein 20 MG TABS Take by mouth.    . vitamin B-12 (CYANOCOBALAMIN) 1000 MCG tablet Take 1,000 mcg by mouth 2 (two) times a week.    . Cholecalciferol (VITAMIN D3) 400 units CAPS Take by mouth 2 (two) times a week.  (Patient not taking: Reported on 02/10/2021)     No current facility-administered medications on file prior to visit.    There are no Patient Instructions on file for this visit. No follow-ups on file.   Kris Hartmann, NP

## 2021-09-12 IMAGING — MG MM DIGITAL SCREENING BILAT W/ TOMO AND CAD
8 series · 9 of 24 positions shown · non-contrast
Comparison: Previous exam(s).

CLINICAL DATA: Screening.

EXAM:
DIGITAL SCREENING BILATERAL MAMMOGRAM WITH TOMO AND CAD

[R MLO synth-2D]
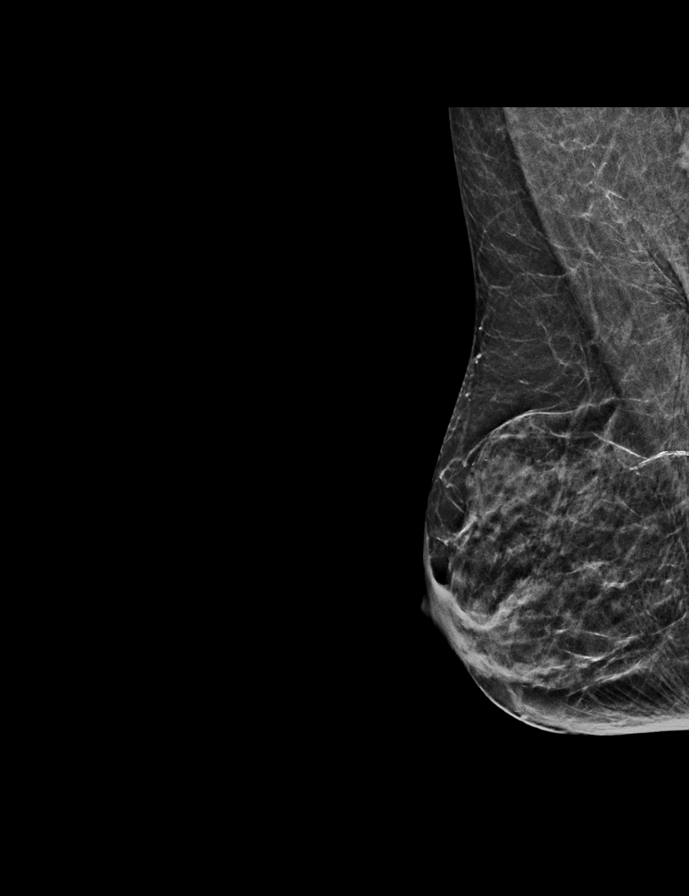

[L MLO synth-2D]
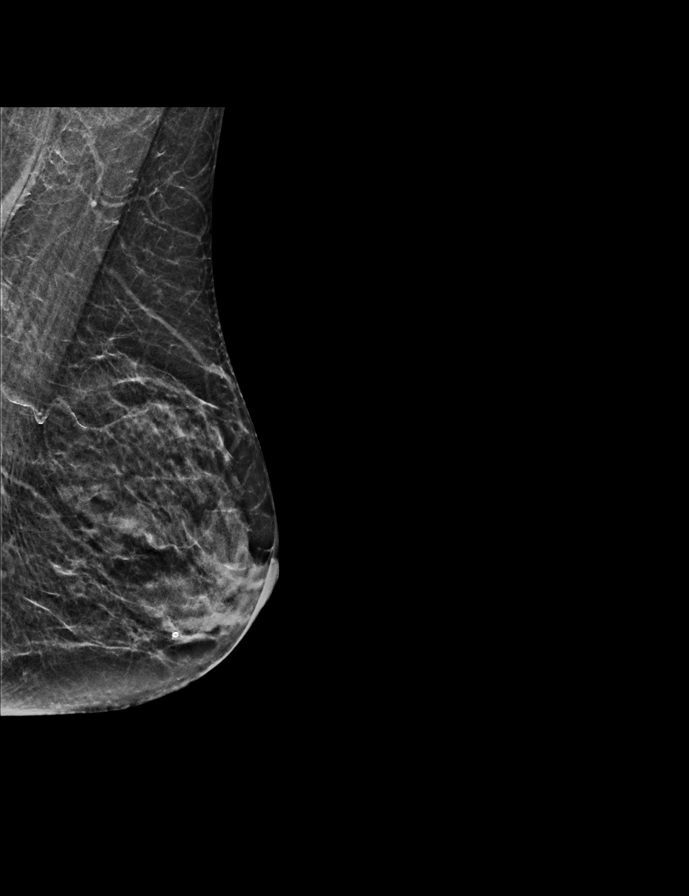

[L CC synth-2D]
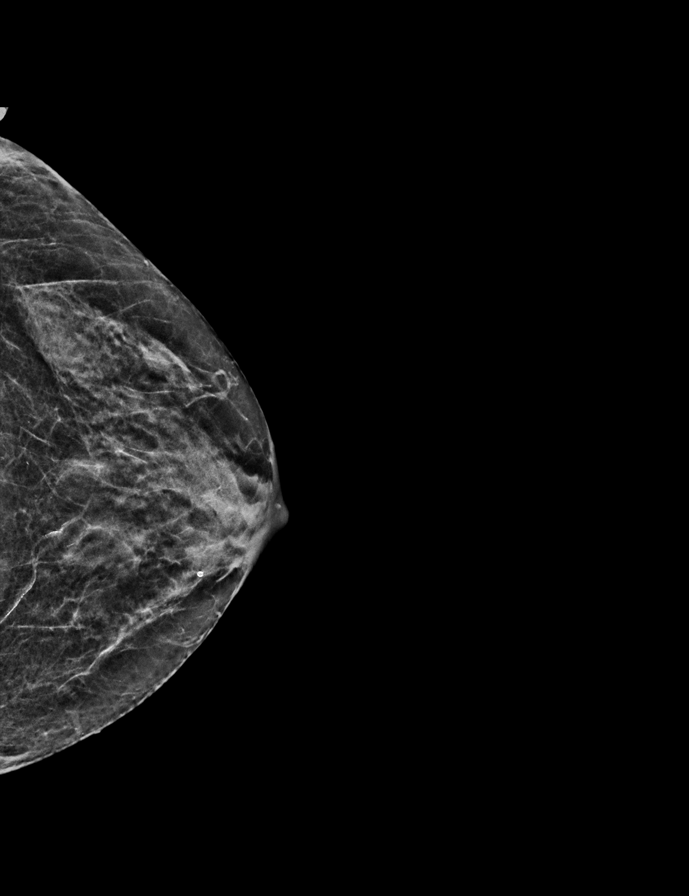

[R CC synth-2D]
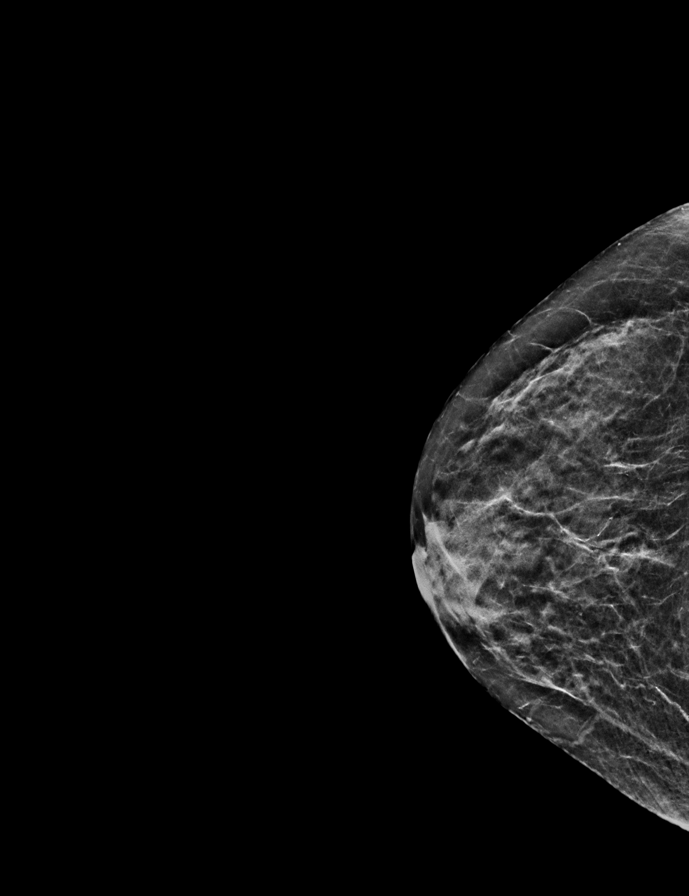

[L MLO tomo · 2 of 41 frames shown]
[frame 14/41]
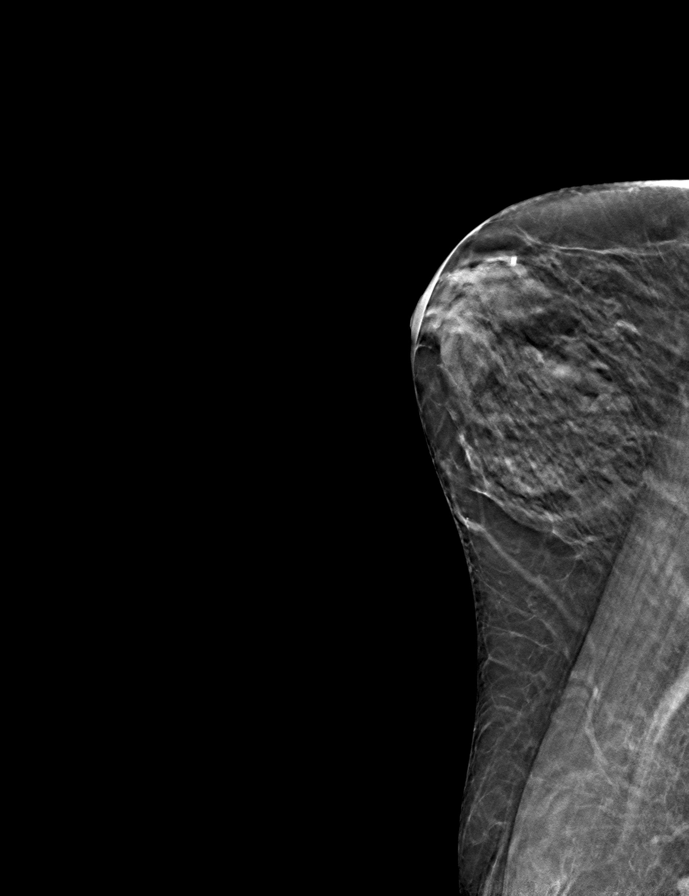
[frame 21/41]
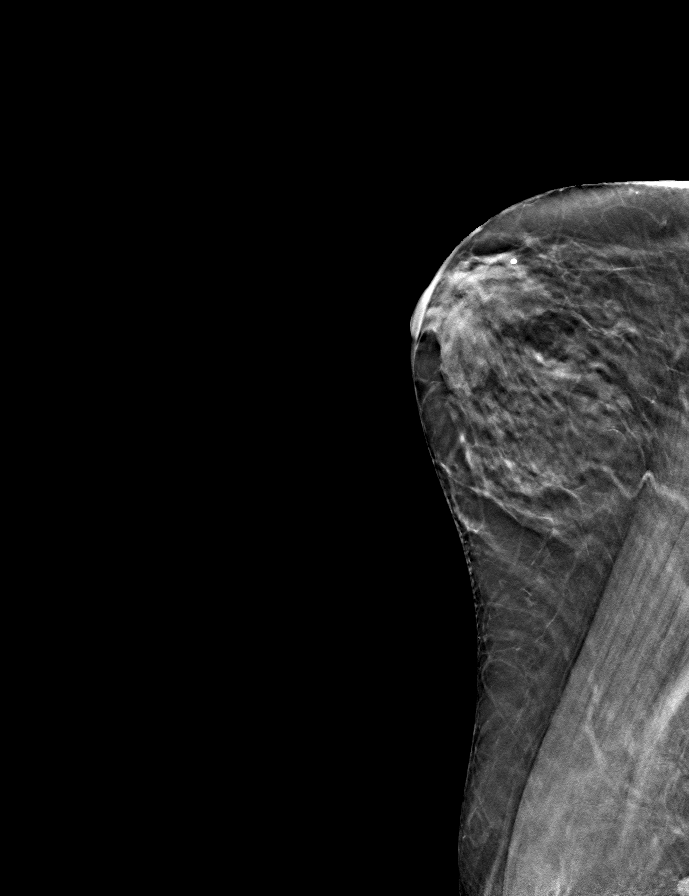

[R MLO tomo · tomo slice 23/44.0]
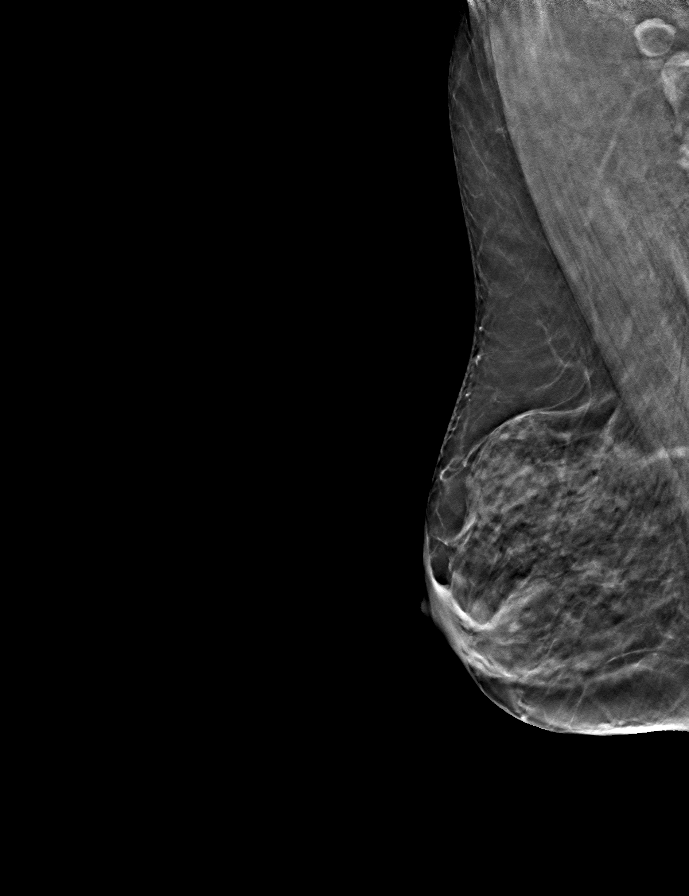

[L CC tomo · tomo slice 20/39.0]
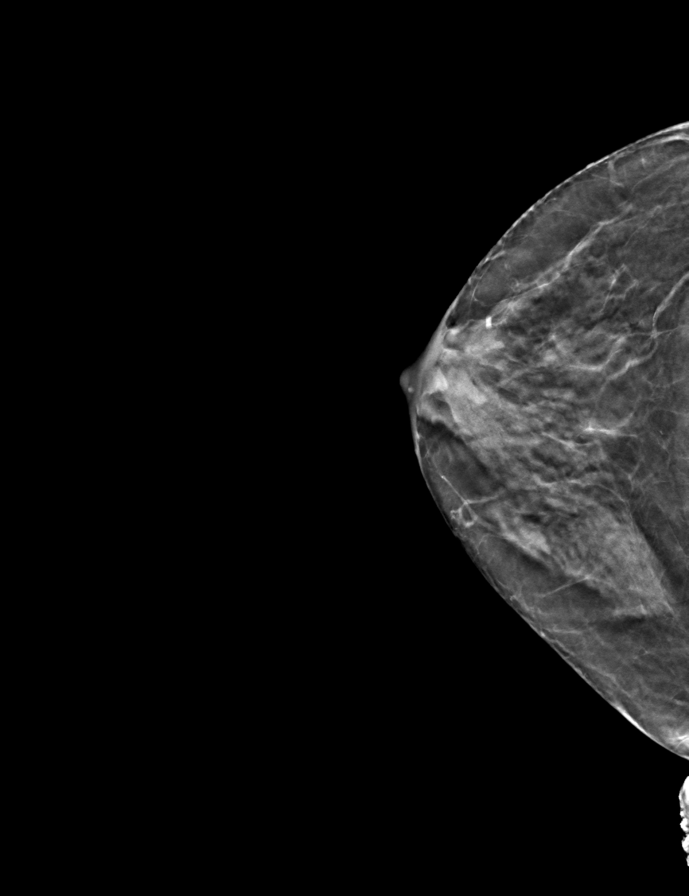

[R CC tomo · tomo slice 19/38.0]
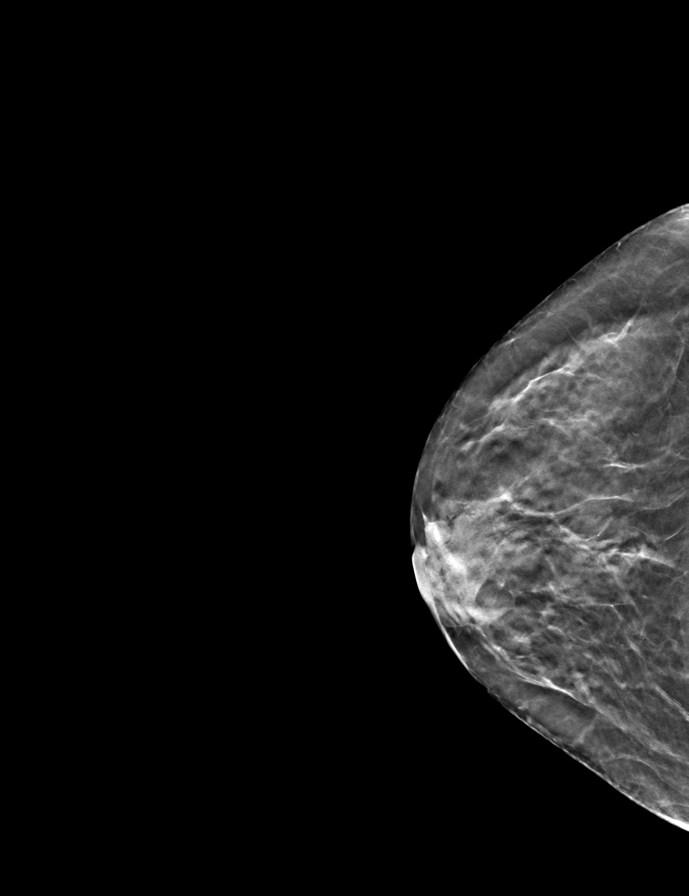

[9 of 24 positions shown; findings below may reference images not displayed]

ACR Breast Density Category c: The breast tissue is heterogeneously
dense, which may obscure small masses.
FINDINGS: There are no findings suspicious for malignancy. The images were
evaluated with computer-aided detection.
IMPRESSION: No mammographic evidence of malignancy. A result letter of this
screening mammogram will be mailed directly to the patient.

RECOMMENDATION:
Screening mammogram in one year. (Code:[id number])

BI-RADS CATEGORY  1: Negative.

## 2021-09-22 ENCOUNTER — Other Ambulatory Visit: Payer: Self-pay | Admitting: Physician Assistant

## 2021-09-22 DIAGNOSIS — Z1231 Encounter for screening mammogram for malignant neoplasm of breast: Secondary | ICD-10-CM

## 2021-11-05 ENCOUNTER — Ambulatory Visit
Admission: RE | Admit: 2021-11-05 | Discharge: 2021-11-05 | Disposition: A | Discharge status: Home / Self Care | Payer: Medicare PPO | Source: Ambulatory Visit | Attending: Physician Assistant | Admitting: Physician Assistant

## 2021-11-05 ENCOUNTER — Other Ambulatory Visit: Payer: Self-pay

## 2021-11-05 DIAGNOSIS — Z1231 Encounter for screening mammogram for malignant neoplasm of breast: Secondary | ICD-10-CM | POA: Diagnosis present

## 2021-11-12 ENCOUNTER — Telehealth: Payer: Self-pay

## 2021-11-12 NOTE — Telephone Encounter (Signed)
ERROR

## 2021-11-18 ENCOUNTER — Telehealth: Payer: Self-pay

## 2021-11-18 NOTE — Telephone Encounter (Signed)
Called patient from referral she dosent think she needs a appointment at this time and wanted to be taken out of call list

## 2022-06-24 ENCOUNTER — Other Ambulatory Visit: Payer: Self-pay | Admitting: Physician Assistant

## 2022-06-24 DIAGNOSIS — Z1231 Encounter for screening mammogram for malignant neoplasm of breast: Secondary | ICD-10-CM

## 2022-08-08 ENCOUNTER — Encounter (INDEPENDENT_AMBULATORY_CARE_PROVIDER_SITE_OTHER): Payer: Self-pay

## 2022-10-11 ENCOUNTER — Ambulatory Visit
Admission: RE | Admit: 2022-10-11 | Discharge: 2022-10-11 | Disposition: A | Discharge status: Home / Self Care | Payer: Medicare PPO | Source: Ambulatory Visit | Attending: Physician Assistant | Admitting: Physician Assistant

## 2022-10-11 DIAGNOSIS — Z1231 Encounter for screening mammogram for malignant neoplasm of breast: Secondary | ICD-10-CM | POA: Insufficient documentation

## 2022-11-26 LAB — AMB RESULTS CONSOLE CBG: Glucose: 123

## 2022-11-26 NOTE — Progress Notes (Signed)
Pt not fasting

## 2022-12-05 ENCOUNTER — Encounter: Payer: Self-pay | Admitting: *Deleted

## 2022-12-05 NOTE — Progress Notes (Signed)
Pt has established PCP, Dr. Paulita Cradle, with whom she has had ongoing care throughout the past rolling 12 months, including a complete annual physical on 10/11/22, and w/ whom she has a CPX appt scheduled again on 11/04/23. 11/26/22 screening event screening results wnl and no SDOH barriers noted as of this event. No additional health equity team support indicated at this time.

## 2023-08-29 ENCOUNTER — Other Ambulatory Visit: Payer: Self-pay | Admitting: Physician Assistant

## 2023-08-29 DIAGNOSIS — Z1231 Encounter for screening mammogram for malignant neoplasm of breast: Secondary | ICD-10-CM

## 2023-10-16 ENCOUNTER — Ambulatory Visit
Admission: RE | Admit: 2023-10-16 | Discharge: 2023-10-16 | Disposition: A | Discharge status: Home / Self Care | Payer: Medicare PPO | Source: Ambulatory Visit | Attending: Physician Assistant | Admitting: Physician Assistant

## 2023-10-16 DIAGNOSIS — Z1231 Encounter for screening mammogram for malignant neoplasm of breast: Secondary | ICD-10-CM | POA: Diagnosis present

## 2024-07-10 ENCOUNTER — Other Ambulatory Visit: Payer: Self-pay | Admitting: Physician Assistant

## 2024-07-10 DIAGNOSIS — Z1231 Encounter for screening mammogram for malignant neoplasm of breast: Secondary | ICD-10-CM

## 2024-10-17 ENCOUNTER — Ambulatory Visit
Admission: RE | Admit: 2024-10-17 | Discharge: 2024-10-17 | Disposition: A | Discharge status: Home / Self Care | Source: Ambulatory Visit | Attending: Physician Assistant | Admitting: Physician Assistant

## 2024-10-17 DIAGNOSIS — Z1231 Encounter for screening mammogram for malignant neoplasm of breast: Secondary | ICD-10-CM | POA: Diagnosis present
